# Patient Record
Sex: Male | Born: 1935 | Race: White | Hispanic: No | Marital: Single | Smoking: Former smoker
Health system: Southern US, Community
[De-identification: ages and names within clinical notes are randomized; demographics above are authoritative.]

## PROBLEM LIST (undated history)

## (undated) DIAGNOSIS — K2971 Gastritis, unspecified, with bleeding: Secondary | ICD-10-CM

## (undated) DIAGNOSIS — I10 Essential (primary) hypertension: Secondary | ICD-10-CM

## (undated) DIAGNOSIS — M6281 Muscle weakness (generalized): Secondary | ICD-10-CM

## (undated) DIAGNOSIS — J449 Chronic obstructive pulmonary disease, unspecified: Secondary | ICD-10-CM

## (undated) HISTORY — PX: APPENDECTOMY: SHX54

## (undated) HISTORY — PX: HERNIA REPAIR: SHX51

## (undated) HISTORY — PX: TOTAL HIP ARTHROPLASTY: SHX124

---

## 2017-03-16 ENCOUNTER — Institutional Professional Consult (permissible substitution): Payer: Self-pay | Admitting: Internal Medicine

## 2017-04-12 ENCOUNTER — Inpatient Hospital Stay (HOSPITAL_COMMUNITY)
Admission: EM | Admit: 2017-04-12 | Discharge: 2017-05-08 | DRG: 393 | Disposition: E | Payer: Medicare Other | Attending: Internal Medicine | Admitting: Internal Medicine

## 2017-04-12 ENCOUNTER — Emergency Department (HOSPITAL_COMMUNITY): Payer: Medicare Other

## 2017-04-12 ENCOUNTER — Encounter (HOSPITAL_COMMUNITY): Payer: Self-pay | Admitting: Emergency Medicine

## 2017-04-12 DIAGNOSIS — I700 Atherosclerosis of aorta: Secondary | ICD-10-CM | POA: Diagnosis present

## 2017-04-12 DIAGNOSIS — Z8249 Family history of ischemic heart disease and other diseases of the circulatory system: Secondary | ICD-10-CM | POA: Diagnosis not present

## 2017-04-12 DIAGNOSIS — L89152 Pressure ulcer of sacral region, stage 2: Secondary | ICD-10-CM | POA: Diagnosis present

## 2017-04-12 DIAGNOSIS — J449 Chronic obstructive pulmonary disease, unspecified: Secondary | ICD-10-CM | POA: Diagnosis not present

## 2017-04-12 DIAGNOSIS — Z66 Do not resuscitate: Secondary | ICD-10-CM | POA: Diagnosis present

## 2017-04-12 DIAGNOSIS — I771 Stricture of artery: Secondary | ICD-10-CM | POA: Diagnosis present

## 2017-04-12 DIAGNOSIS — I959 Hypotension, unspecified: Secondary | ICD-10-CM | POA: Diagnosis not present

## 2017-04-12 DIAGNOSIS — Z8711 Personal history of peptic ulcer disease: Secondary | ICD-10-CM | POA: Diagnosis not present

## 2017-04-12 DIAGNOSIS — E875 Hyperkalemia: Secondary | ICD-10-CM

## 2017-04-12 DIAGNOSIS — K55059 Acute (reversible) ischemia of intestine, part and extent unspecified: Principal | ICD-10-CM

## 2017-04-12 DIAGNOSIS — Z808 Family history of malignant neoplasm of other organs or systems: Secondary | ICD-10-CM | POA: Diagnosis not present

## 2017-04-12 DIAGNOSIS — Z9981 Dependence on supplemental oxygen: Secondary | ICD-10-CM

## 2017-04-12 DIAGNOSIS — J189 Pneumonia, unspecified organism: Secondary | ICD-10-CM

## 2017-04-12 DIAGNOSIS — R001 Bradycardia, unspecified: Secondary | ICD-10-CM | POA: Diagnosis not present

## 2017-04-12 DIAGNOSIS — Z77090 Contact with and (suspected) exposure to asbestos: Secondary | ICD-10-CM | POA: Diagnosis present

## 2017-04-12 DIAGNOSIS — I10 Essential (primary) hypertension: Secondary | ICD-10-CM | POA: Diagnosis present

## 2017-04-12 DIAGNOSIS — N179 Acute kidney failure, unspecified: Secondary | ICD-10-CM

## 2017-04-12 DIAGNOSIS — R680 Hypothermia, not associated with low environmental temperature: Secondary | ICD-10-CM | POA: Diagnosis not present

## 2017-04-12 DIAGNOSIS — R0902 Hypoxemia: Secondary | ICD-10-CM | POA: Diagnosis not present

## 2017-04-12 DIAGNOSIS — J432 Centrilobular emphysema: Secondary | ICD-10-CM | POA: Diagnosis present

## 2017-04-12 DIAGNOSIS — E874 Mixed disorder of acid-base balance: Secondary | ICD-10-CM | POA: Diagnosis not present

## 2017-04-12 DIAGNOSIS — K551 Chronic vascular disorders of intestine: Secondary | ICD-10-CM | POA: Diagnosis present

## 2017-04-12 DIAGNOSIS — R0603 Acute respiratory distress: Secondary | ICD-10-CM

## 2017-04-12 DIAGNOSIS — N170 Acute kidney failure with tubular necrosis: Secondary | ICD-10-CM

## 2017-04-12 DIAGNOSIS — J690 Pneumonitis due to inhalation of food and vomit: Secondary | ICD-10-CM

## 2017-04-12 DIAGNOSIS — Z515 Encounter for palliative care: Secondary | ICD-10-CM | POA: Diagnosis not present

## 2017-04-12 DIAGNOSIS — R1032 Left lower quadrant pain: Secondary | ICD-10-CM | POA: Diagnosis present

## 2017-04-12 DIAGNOSIS — L8990 Pressure ulcer of unspecified site, unspecified stage: Secondary | ICD-10-CM | POA: Insufficient documentation

## 2017-04-12 DIAGNOSIS — E860 Dehydration: Secondary | ICD-10-CM | POA: Diagnosis present

## 2017-04-12 DIAGNOSIS — Y95 Nosocomial condition: Secondary | ICD-10-CM | POA: Diagnosis present

## 2017-04-12 DIAGNOSIS — R109 Unspecified abdominal pain: Secondary | ICD-10-CM | POA: Diagnosis present

## 2017-04-12 HISTORY — DX: Muscle weakness (generalized): M62.81

## 2017-04-12 HISTORY — DX: Essential (primary) hypertension: I10

## 2017-04-12 HISTORY — DX: Chronic obstructive pulmonary disease, unspecified: J44.9

## 2017-04-12 HISTORY — DX: Gastritis, unspecified, with bleeding: K29.71

## 2017-04-12 LAB — COMPREHENSIVE METABOLIC PANEL
ALT: 7 U/L — ABNORMAL LOW (ref 17–63)
ANION GAP: 10 (ref 5–15)
AST: 15 U/L (ref 15–41)
Albumin: 3 g/dL — ABNORMAL LOW (ref 3.5–5.0)
Alkaline Phosphatase: 68 U/L (ref 38–126)
BUN: 49 mg/dL — ABNORMAL HIGH (ref 6–20)
CHLORIDE: 95 mmol/L — AB (ref 101–111)
CO2: 30 mmol/L (ref 22–32)
Calcium: 10 mg/dL (ref 8.9–10.3)
Creatinine, Ser: 1.81 mg/dL — ABNORMAL HIGH (ref 0.61–1.24)
GFR, EST AFRICAN AMERICAN: 37 mL/min — AB (ref >60)
GFR, EST NON AFRICAN AMERICAN: 32 mL/min — AB (ref >60)
Glucose, Bld: 114 mg/dL — ABNORMAL HIGH (ref 65–99)
POTASSIUM: 4.3 mmol/L (ref 3.5–5.1)
Sodium: 135 mmol/L (ref 135–145)
Total Bilirubin: 1.2 mg/dL (ref 0.3–1.2)
Total Protein: 7.6 g/dL (ref 6.5–8.1)

## 2017-04-12 LAB — CBC WITH DIFFERENTIAL/PLATELET
BASOS ABS: 0 10*3/uL (ref 0.0–0.1)
Basophils Relative: 0 %
EOS PCT: 1 %
Eosinophils Absolute: 0.1 10*3/uL (ref 0.0–0.7)
HCT: 38.1 % — ABNORMAL LOW (ref 39.0–52.0)
Hemoglobin: 12.6 g/dL — ABNORMAL LOW (ref 13.0–17.0)
LYMPHS PCT: 17 %
Lymphs Abs: 3.3 10*3/uL (ref 0.7–4.0)
MCH: 28.7 pg (ref 26.0–34.0)
MCHC: 33.1 g/dL (ref 30.0–36.0)
MCV: 86.8 fL (ref 78.0–100.0)
MONO ABS: 1.4 10*3/uL — AB (ref 0.1–1.0)
Monocytes Relative: 7 %
Neutro Abs: 15 10*3/uL — ABNORMAL HIGH (ref 1.7–7.7)
Neutrophils Relative %: 75 %
PLATELETS: 424 10*3/uL — AB (ref 150–400)
RBC: 4.39 MIL/uL (ref 4.22–5.81)
RDW: 14.1 % (ref 11.5–15.5)
WBC: 19.8 10*3/uL — ABNORMAL HIGH (ref 4.0–10.5)

## 2017-04-12 LAB — LIPASE, BLOOD: LIPASE: 27 U/L (ref 11–51)

## 2017-04-12 LAB — I-STAT TROPONIN, ED: TROPONIN I, POC: 0.02 ng/mL (ref 0.00–0.08)

## 2017-04-12 LAB — URINALYSIS, ROUTINE W REFLEX MICROSCOPIC
Bilirubin Urine: NEGATIVE
Glucose, UA: NEGATIVE mg/dL
Hgb urine dipstick: NEGATIVE
KETONES UR: NEGATIVE mg/dL
LEUKOCYTES UA: NEGATIVE
NITRITE: NEGATIVE
PROTEIN: NEGATIVE mg/dL
Specific Gravity, Urine: 1.012 (ref 1.005–1.030)
pH: 5 (ref 5.0–8.0)

## 2017-04-12 MED ORDER — VANCOMYCIN HCL IN DEXTROSE 1-5 GM/200ML-% IV SOLN: 1000.0000 mg | Freq: Every day | INTRAVENOUS | Status: DC

## 2017-04-12 MED ORDER — PHENYLEPHRINE-MINERAL OIL-PET 0.25-14-71.9 % RE OINT: 1.0000 "application " | TOPICAL_OINTMENT | Freq: Every day | RECTAL | Status: DC | PRN

## 2017-04-12 MED ORDER — ENOXAPARIN SODIUM 40 MG/0.4ML ~~LOC~~ SOLN
40.0000 mg | SUBCUTANEOUS | Status: DC
  Filled 2017-04-12: qty 0.4

## 2017-04-12 MED ORDER — MOMETASONE FURO-FORMOTEROL FUM 200-5 MCG/ACT IN AERO
2.0000 | INHALATION_SPRAY | Freq: Two times a day (BID) | RESPIRATORY_TRACT | Status: DC
  Administered 2017-04-13 – 2017-04-14 (×2): 2 via RESPIRATORY_TRACT
  Filled 2017-04-12: qty 8.8

## 2017-04-12 MED ORDER — SODIUM CHLORIDE 0.9 % IV SOLN: 250.0000 mL | INTRAVENOUS | Status: DC | PRN

## 2017-04-12 MED ORDER — SODIUM CHLORIDE 0.9 % IV BOLUS (SEPSIS)
1000.0000 mL | Freq: Once | INTRAVENOUS | Status: AC
  Administered 2017-04-12: 1000 mL via INTRAVENOUS

## 2017-04-12 MED ORDER — DEXTROSE 5 % IV SOLN: 1.0000 g | INTRAVENOUS | Status: DC

## 2017-04-12 MED ORDER — PHENYLEPH-SHARK LIV OIL-MO-PET 0.25-3-14-71.9 % RE OINT
TOPICAL_OINTMENT | Freq: Every day | RECTAL | Status: DC | PRN
Start: 2017-04-12 — End: 2017-04-15

## 2017-04-12 MED ORDER — PANTOPRAZOLE SODIUM 40 MG PO TBEC
40.0000 mg | DELAYED_RELEASE_TABLET | Freq: Two times a day (BID) | ORAL | Status: DC
  Administered 2017-04-12: 40 mg via ORAL
  Filled 2017-04-12: qty 1

## 2017-04-12 MED ORDER — MELATONIN 3 MG PO TABS
9.0000 mg | ORAL_TABLET | Freq: Every day | ORAL | Status: DC
  Administered 2017-04-12: 9 mg via ORAL
  Filled 2017-04-12 (×4): qty 3

## 2017-04-12 MED ORDER — SODIUM CHLORIDE 0.9 % IV SOLN
INTRAVENOUS | Status: DC
  Administered 2017-04-12: via INTRAVENOUS

## 2017-04-12 MED ORDER — SALINE SPRAY 0.65 % NA SOLN: 1.0000 | NASAL | Status: DC | PRN

## 2017-04-12 MED ORDER — SODIUM CHLORIDE 0.9% FLUSH: 3.0000 mL | INTRAVENOUS | Status: DC | PRN

## 2017-04-12 MED ORDER — HYDROMORPHONE HCL 1 MG/ML IJ SOLN
0.5000 mg | Freq: Once | INTRAMUSCULAR | Status: AC
  Administered 2017-04-12: 0.5 mg via INTRAVENOUS
  Filled 2017-04-12: qty 0.5

## 2017-04-12 MED ORDER — DEXTROSE 5 % IV SOLN
2.0000 g | Freq: Every day | INTRAVENOUS | Status: DC
  Administered 2017-04-12: 2 g via INTRAVENOUS
  Filled 2017-04-12: qty 2

## 2017-04-12 MED ORDER — BISACODYL 10 MG RE SUPP: 10.0000 mg | Freq: Every day | RECTAL | Status: DC | PRN

## 2017-04-12 MED ORDER — IPRATROPIUM-ALBUTEROL 0.5-2.5 (3) MG/3ML IN SOLN: 3.0000 mL | Freq: Four times a day (QID) | RESPIRATORY_TRACT | Status: DC | PRN

## 2017-04-12 MED ORDER — SODIUM CHLORIDE 0.9% FLUSH
3.0000 mL | Freq: Two times a day (BID) | INTRAVENOUS | Status: DC
  Administered 2017-04-12 – 2017-04-13 (×2): 3 mL via INTRAVENOUS

## 2017-04-12 MED ORDER — HYDROCORTISONE ACETATE 25 MG RE SUPP
25.0000 mg | Freq: Every day | RECTAL | Status: DC
  Filled 2017-04-12 (×3): qty 1

## 2017-04-12 MED ORDER — SODIUM CHLORIDE 0.9 % IV SOLN
3.0000 g | Freq: Two times a day (BID) | INTRAVENOUS | Status: DC
  Administered 2017-04-12 – 2017-04-13 (×2): 3 g via INTRAVENOUS
  Filled 2017-04-12 (×3): qty 3

## 2017-04-12 MED ORDER — MELATONIN 5 MG PO TABS
10.0000 mg | ORAL_TABLET | Freq: Every day | ORAL | Status: DC
  Filled 2017-04-12: qty 2

## 2017-04-12 MED ORDER — TIOTROPIUM BROMIDE MONOHYDRATE 18 MCG IN CAPS
18.0000 ug | ORAL_CAPSULE | Freq: Every day | RESPIRATORY_TRACT | Status: DC
  Administered 2017-04-14: 18 ug via RESPIRATORY_TRACT
  Filled 2017-04-12: qty 5

## 2017-04-12 MED ORDER — SENNOSIDES-DOCUSATE SODIUM 8.6-50 MG PO TABS
2.0000 | ORAL_TABLET | Freq: Every day | ORAL | Status: DC
  Administered 2017-04-12: 2 via ORAL
  Filled 2017-04-12: qty 2

## 2017-04-12 NOTE — Progress Notes (Signed)
Pharmacy Antibiotic Note  Jason GailsHerbert Martin is a 83 y.o. male admitted on 05/03/2017 with Aspiration pneumonia.  Pharmacy has been consulted for unasyn dosing.  Pt has history of chronic aspiration in the setting of COPD.  WBC 19.8, afeb, with a CrCl estimated at 25 mL/min.    Plan: Start ampicillin/sulbactam 3 g IV q 12h F/u kidney function/updated ht+wt and adj dose as appropriate F/u CXR, clinical progression and LOT   Weight: 140 lb (63.5 kg)  Temp (24hrs), Avg:97.5 F (36.4 C), Min:97.5 F (36.4 C), Max:97.5 F (36.4 C)   Recent Labs Lab 17-Aug-2017 0910  WBC 19.8*  CREATININE 1.81*    CrCl cannot be calculated (Unknown ideal weight.).    No Known Allergies  Antimicrobials this admission: 8/6 unasyn>  Dose adjustments this admission: n/a  Microbiology results: None collected  Jason PoseyJonathan Valeda Martin, PharmD Pharmacy Resident Pager #: [phone] 04/14/2017 6:58 PM

## 2017-04-12 NOTE — ED Provider Notes (Signed)
[facility]-EMERGENCY DEPT Provider Note   CSN: [id number] Arrival date & time: 21-Apr-2017  0849     History   Chief Complaint Chief Complaint  Patient presents with  . Chest Pain    HPI Jason Martin is a 83 y.o. male.  The history is provided by the patient and medical records.  Chest Pain   Associated symptoms include abdominal pain and shortness of breath.    83 year old male with history of COPD, gastritis, hypertension, muscle weakness, presenting to the ED with chest pain and abdominal pain. Patient reports he has had abdominal pain for about 2 weeks now. States it is mostly in the lower abdomen, worse on his left side. States pain is worse when he tries to eat so he has been mostly drinking just liquids for about 2 weeks now. He denies any fever or chills. No vomiting. He denies any diarrhea. States his buttocks is sore as well.  Patient states "every now and again" he will get some chest pain. Patient cannot elaborate on this further, states it just comes and goes. Does not seem that there are any alleviating or exacerbating factors associated. States he is short of breath "all the time". Patient is on 2-3L supplemental oxygen at all times.  Past Medical History:  Diagnosis Date  . COPD (chronic obstructive pulmonary disease) (HCC)   . Gastritis with bleeding   . Hypertension   . Muscle weakness     There are no active problems to display for this patient.   No past surgical history on file.     Home Medications    Prior to Admission medications   Not on File    Family History No family history on file.  Social History Social History  Substance Use Topics  . Smoking status: Not on file  . Smokeless tobacco: Not on file  . Alcohol use Not on file     Allergies   Patient has no known allergies.   Review of Systems Review of Systems  Respiratory: Positive for shortness of breath.   Cardiovascular: Positive for chest pain.  Gastrointestinal: Positive for  abdominal pain.  All other systems reviewed and are negative.    Physical Exam Updated Vital Signs BP (!) 133/101   Pulse 90   Temp (!) 97.5 F (36.4 C) (Oral)   Resp 16   SpO2 96%   Physical Exam  Constitutional: He is oriented to person, place, and time. He appears well-developed and well-nourished.  HENT:  Head: Normocephalic and atraumatic.  Mouth/Throat: Oropharynx is clear and moist.  Eyes: Pupils are equal, round, and reactive to light. Conjunctivae and EOM are normal.  Neck: Normal range of motion.  Cardiovascular: Normal rate, regular rhythm and normal heart sounds.   Pulmonary/Chest: Effort normal and breath sounds normal.  Breath sounds are junky, wet cough that is non-productive, O2 sats 98% on 2L  Abdominal: Soft. Bowel sounds are normal. There is tenderness in the left lower quadrant.    Genitourinary:  Genitourinary Comments: Stage II decubitus ulcer of sacrum with barrier cream on top; surrounding skin appears irritated; there is no purulent drainage, crusting, or necrotic skin changes present  Musculoskeletal: Normal range of motion.  Neurological: He is alert and oriented to person, place, and time.  Skin: Skin is warm and dry.  Psychiatric: He has a normal mood and affect.  Nursing note and vitals reviewed.    ED Treatments / Results  Labs (all labs ordered are listed, but only abnormal results are  displayed) Labs Reviewed  CBC WITH DIFFERENTIAL/PLATELET - Abnormal; Notable for the following:       Result Value   WBC 19.8 (*)    Hemoglobin 12.6 (*)    HCT 38.1 (*)    Platelets 424 (*)    Neutro Abs 15.0 (*)    Monocytes Absolute 1.4 (*)    All other components within normal limits  COMPREHENSIVE METABOLIC PANEL - Abnormal; Notable for the following:    Chloride 95 (*)    Glucose, Bld 114 (*)    BUN 49 (*)    Creatinine, Ser 1.81 (*)    Albumin 3.0 (*)    ALT 7 (*)    GFR calc non Af Amer 32 (*)    GFR calc Af Amer 37 (*)    All other  components within normal limits  LIPASE, BLOOD  URINALYSIS, ROUTINE W REFLEX MICROSCOPIC  I-STAT TROPONIN, ED    EKG  EKG Interpretation  Date/Time:  Monday April 12 2017 09:01:38 EDT Ventricular Rate:  94 PR Interval:    QRS Duration: 112 QT Interval:  376 QTC Calculation: 471 R Axis:   81 Text Interpretation:  Sinus rhythm Ventricular premature complex Borderline prolonged PR interval Borderline intraventricular conduction delay Low voltage, extremity and precordial leads Borderline repolarization abnormality No old tracing to compare Confirmed by Rolan Bucco [phone]) on 04/20/2017 9:04:15 AM       Radiology Ct Abdomen Pelvis Wo Contrast  Result Date: 04/14/2017 CLINICAL DATA:  83 year old male with history of left lower quadrant abdominal pain for the past week. Abnormal chest x-ray today. EXAM: CT CHEST, ABDOMEN AND PELVIS WITHOUT CONTRAST TECHNIQUE: Multidetector CT imaging of the chest, abdomen and pelvis was performed following the standard protocol without IV contrast. COMPARISON:  No priors. FINDINGS: CT CHEST FINDINGS Cardiovascular: Heart size is normal. There is no significant pericardial fluid, thickening or pericardial calcification. There is aortic atherosclerosis, as well as atherosclerosis of the great vessels of the mediastinum and the coronary arteries, including calcified atherosclerotic plaque in the left main, left anterior descending, left circumflex and right coronary arteries. Calcifications of the aortic valve and mitral annulus. Mediastinum/Nodes: No pathologically enlarged mediastinal or hilar lymph nodes. Esophagus is unremarkable in appearance. No axillary lymphadenopathy. Lungs/Pleura: There is near complete collapse/consolidation of the left lower lobe, with extensive bronchiectasis within the left lower lobe, much of which is opacified by retained mucus. Widespread bronchial wall thickening, thickening of the peribronchovascular interstitium with  peribronchovascular micro and macronodularity noted elsewhere in the lungs bilaterally. Background of mild to moderate centrilobular and paraseptal emphysema. Thick-walled cavity measuring approximately 2.3 x 5.7 x 4.0 cm (axial image 50 of series 5) in the posterior aspect of the left upper lobe has some dependent fluid/ debris. Musculoskeletal: Mild compression of superior endplate anteriorly at T6 with loss in 10% loss of anterior vertebral body height. Multiple old healed left-sided rib fractures. There are no aggressive appearing lytic or blastic lesions noted in the visualized portions of the skeleton. CT ABDOMEN PELVIS FINDINGS Hepatobiliary: No definite cystic or solid hepatic lesions are identified on today's noncontrast CT examination. Tiny partially calcified gallstones lie dependently in the gallbladder. Gallbladder is moderately distended. No gallbladder wall thickening, pericholecystic fluid or surrounding inflammatory changes are noted to strongly suggest an acute cholecystitis at this time. Pancreas: No definite pancreatic mass or peripancreatic inflammatory changes are noted on today's noncontrast CT examination. Spleen: Unremarkable. Adrenals/Urinary Tract: Unenhanced appearance of the kidneys and bilateral adrenal glands is normal. No hydroureteronephrosis. Urinary  bladder is partially obscured by beam hardening artifact from the patient's left hip arthroplasty, but visualized portions of the urinary bladder are unremarkable in appearance. Stomach/Bowel: Unenhanced appearance of the stomach is normal. There is no pathologic dilatation of small bowel or colon. Numerous colonic diverticulae are noted, most evident in the sigmoid colon and descending colon, without overt surrounding inflammatory changes to strongly suggest an acute diverticulitis at this time. The appendix is not confidently identified and may be surgically absent. Regardless, there are no inflammatory changes noted adjacent to the  cecum to suggest the presence of an acute appendicitis at this time. Vascular/Lymphatic: Aortic atherosclerosis, without definite aneurysm in the abdominal or pelvic vasculature. No lymphadenopathy noted in the abdomen or pelvis on today's noncontrast CT examination. Reproductive: Prostate gland and seminal vesicles are unremarkable in appearance. Other: No significant volume of ascites.  No pneumoperitoneum. Musculoskeletal: Status post left hip arthroplasty. Old healed fractures of the left superior and inferior pubic rami. There are no aggressive appearing lytic or blastic lesions noted in the visualized portions of the skeleton. IMPRESSION: 1. No acute findings in the abdomen or pelvis to account for the patient's symptoms. However, there is extensive colonic diverticulosis, particularly of the descending colon and sigmoid colon. 2. 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 At this time, this is complicated by a near complete collapse and consolidation of the left lower lobe, as well as the presence of a thick-walled cavitary area in the posterior aspect of the left upper lobe, as detailed above. 3. Mild to moderate centrilobular and paraseptal emphysema; imaging findings suggestive of underlying COPD. 4. Aortic atherosclerosis, in addition to left main and 3 vessel coronary artery disease. 5. There are calcifications of the aortic valve and mitral annulus. Echocardiographic correlation for evaluation of potential valvular dysfunction may be warranted if clinically indicated. Aortic Atherosclerosis (ICD10-I70.0) and Emphysema (ICD10-J43.9). Electronically Signed   By: Trudie Reedaniel  Entrikin M.D.   On: 04/19/2017 11:08   Dg Chest 2 View  Result Date: 04/09/2017 CLINICAL DATA:  Chest pain, shortness of breath. EXAM: CHEST  2 VIEW COMPARISON:  None. FINDINGS: The cardiomediastinal silhouette is normal in size. Atherosclerotic  calcification of the aortic arch. Changes of COPD with patchy increased density and atelectasis within the left lung. Increased retrocardiac density with silhouetting of the descending aorta. Patchy density in the right lower lobe. Small left pleural effusion. No acute osseous abnormality. IMPRESSION: 1. Reduced left lung volume with possible left lower lobe collapse. Recommend CT chest with contrast for further evaluation. 2. Patchy opacities throughout the left lung and within the right lower lobe, which could represent pneumonia in the proper clinical setting. 3.  Emphysema. (ICD10-J43.9) 4.  Aortic Atherosclerosis (ICD10-I70.0). Electronically Signed   By: Obie DredgeWilliam T Derry M.D.   On: 04/11/2017 09:52   Ct Chest Wo Contrast  Result Date: 04/26/2017 CLINICAL DATA:  83 year old male with history of left lower quadrant abdominal pain for the past week. Abnormal chest x-ray today. EXAM: CT CHEST, ABDOMEN AND PELVIS WITHOUT CONTRAST TECHNIQUE: Multidetector CT imaging of the chest, abdomen and pelvis was performed following the standard protocol without IV contrast. COMPARISON:  No priors. FINDINGS: CT CHEST FINDINGS Cardiovascular: Heart size is normal. There is no significant pericardial fluid, thickening or pericardial calcification. There is aortic atherosclerosis, as well as atherosclerosis of the great vessels of the mediastinum and the coronary arteries, including calcified atherosclerotic plaque in the left main, left anterior descending, left  circumflex and right coronary arteries. Calcifications of the aortic valve and mitral annulus. Mediastinum/Nodes: No pathologically enlarged mediastinal or hilar lymph nodes. Esophagus is unremarkable in appearance. No axillary lymphadenopathy. Lungs/Pleura: There is near complete collapse/consolidation of the left lower lobe, with extensive bronchiectasis within the left lower lobe, much of which is opacified by retained mucus. Widespread bronchial wall thickening,  thickening of the peribronchovascular interstitium with peribronchovascular micro and macronodularity noted elsewhere in the lungs bilaterally. Background of mild to moderate centrilobular and paraseptal emphysema. Thick-walled cavity measuring approximately 2.3 x 5.7 x 4.0 cm (axial image 50 of series 5) in the posterior aspect of the left upper lobe has some dependent fluid/ debris. Musculoskeletal: Mild compression of superior endplate anteriorly at T6 with loss in 10% loss of anterior vertebral body height. Multiple old healed left-sided rib fractures. There are no aggressive appearing lytic or blastic lesions noted in the visualized portions of the skeleton. CT ABDOMEN PELVIS FINDINGS Hepatobiliary: No definite cystic or solid hepatic lesions are identified on today's noncontrast CT examination. Tiny partially calcified gallstones lie dependently in the gallbladder. Gallbladder is moderately distended. No gallbladder wall thickening, pericholecystic fluid or surrounding inflammatory changes are noted to strongly suggest an acute cholecystitis at this time. Pancreas: No definite pancreatic mass or peripancreatic inflammatory changes are noted on today's noncontrast CT examination. Spleen: Unremarkable. Adrenals/Urinary Tract: Unenhanced appearance of the kidneys and bilateral adrenal glands is normal. No hydroureteronephrosis. Urinary bladder is partially obscured by beam hardening artifact from the patient's left hip arthroplasty, but visualized portions of the urinary bladder are unremarkable in appearance. Stomach/Bowel: Unenhanced appearance of the stomach is normal. There is no pathologic dilatation of small bowel or colon. Numerous colonic diverticulae are noted, most evident in the sigmoid colon and descending colon, without overt surrounding inflammatory changes to strongly suggest an acute diverticulitis at this time. The appendix is not confidently identified and may be surgically absent. Regardless,  there are no inflammatory changes noted adjacent to the cecum to suggest the presence of an acute appendicitis at this time. Vascular/Lymphatic: Aortic atherosclerosis, without definite aneurysm in the abdominal or pelvic vasculature. No lymphadenopathy noted in the abdomen or pelvis on today's noncontrast CT examination. Reproductive: Prostate gland and seminal vesicles are unremarkable in appearance. Other: No significant volume of ascites.  No pneumoperitoneum. Musculoskeletal: Status post left hip arthroplasty. Old healed fractures of the left superior and inferior pubic rami. There are no aggressive appearing lytic or blastic lesions noted in the visualized portions of the skeleton. IMPRESSION: 1. No acute findings in the abdomen or pelvis to account for the patient's symptoms. However, there is extensive colonic diverticulosis, particularly of the descending colon and sigmoid colon. 2. 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 At this time, this is complicated by a near complete collapse and consolidation of the left lower lobe, as well as the presence of a thick-walled cavitary area in the posterior aspect of the left upper lobe, as detailed above. 3. Mild to moderate centrilobular and paraseptal emphysema; imaging findings suggestive of underlying COPD. 4. Aortic atherosclerosis, in addition to left main and 3 vessel coronary artery disease. 5. There are calcifications of the aortic valve and mitral annulus. Echocardiographic correlation for evaluation of potential valvular dysfunction may be warranted if clinically indicated. Aortic Atherosclerosis (ICD10-I70.0) and Emphysema (ICD10-J43.9). Electronically Signed   By: Trudie Reed M.D.   On: 04/16/2017 11:08    Procedures Procedures (including critical care time)  Medications Ordered in ED Medications - No data to display   Initial Impression /  Assessment and Plan / ED Course  I have reviewed the triage vital signs and the nursing notes.  Pertinent labs & imaging results that were available during my care of the patient were reviewed by me and considered in my medical decision making (see chart for details).  83 year old male here with chest and abdominal pain. Reports lower abdominal pain for about 2 weeks. Reports his appetite has been poor because of this. On exam he does have some tenderness in the left lower abdomen without rebound or guarding.  On exam he does appear clinically dry.  Breath sounds are junky, voice is raspy.  O2 sats stable on his baseline O2.  He is mildly hypothermic. Labs with leukocytosis of 19,000. Creatinine is bumped to 1.81, BUN elevated at 49. Suspect component of dehydration.  Chest x-ray was obtained revealing possible complete left lower lobe collapse. Patchy opacities throughout left lung also noted. Recommended chest CT. Given his elevated creatinine, will need CT scans without contrast. These have been ordered.    CT scan of the abdomen without acute findings.  CT of the chest revealing likely chronic indolent atypical infectious process such as MAI with apparent superimposed acute infection with nearly complete collapse of the LLL.  Given these findings, discussed with pulmonology, Dr. Marlyne Beards-- recommends treatment for HCAP/aspiration, incentive spirometry to try to obtain sputum cultures for now and try to tease out finer details of possible chronic processes at a later time.  Patient appears stable to admit to medicine service.  Discussed with IM teaching service, they will admit for ongoing care.  Patient started on vanc/cefepime in ED.  Final Clinical Impressions(s) / ED Diagnoses   Final diagnoses:  HCAP (healthcare-associated pneumonia)  AKI (acute kidney injury) [facility])    New Prescriptions New Prescriptions   No medications on file     Garlon Hatchet, PA-C 04/19/2017 1503    Rolan Bucco, MD 04/13/17 [phone]

## 2017-04-12 NOTE — ED Notes (Signed)
Patient transported to X-ray 

## 2017-04-12 NOTE — Progress Notes (Addendum)
Pt. Requesting med for lower left quad pain. On cal MD for IMTS paged to make aware.

## 2017-04-12 NOTE — ED Notes (Signed)
Provider has decided to cancel the antibiotic consults. Cefepime already infusing, but will D/C further doses.

## 2017-04-12 NOTE — ED Notes (Signed)
ED Provider at bedside. 

## 2017-04-12 NOTE — Progress Notes (Signed)
Pharmacy Antibiotic Note  Jason GailsHerbert Martin is a 83 y.o. male admitted on 05/02/2017 with pneumonia.  Pharmacy has been consulted for vancomycin and cefepime dosing. WBC 19.8, Temp 97.5, Scr 1.81 (CrCl ~ 26) Provider has decided to cancel the antibiotic consults. Cefepime already infusing, but will D/C further doses.  Plan: Cefepime 2G IV once   Weight: 140 lb (63.5 kg)  Temp (24hrs), Avg:97.5 F (36.4 C), Min:97.5 F (36.4 C), Max:97.5 F (36.4 C)   Recent Labs Lab 02-07-17 0910  WBC 19.8*  CREATININE 1.81*    CrCl cannot be calculated (Unknown ideal weight.).    No Known Allergies   Thank you for allowing pharmacy to be a part of this patient's care.  Toniann Failony L Norina Cowper 04/26/2017 2:26 PM

## 2017-04-12 NOTE — H&P (Signed)
Date: 19-Apr-2017               Patient Name:  Jason Martin MRN: [id number]  DOB: 06/19/1926 Age / Sex: 83 y.o., male   PCP: System, Pcp Not In         Medical Service: Internal Medicine Teaching Service         Attending Physician: Dr. Rolan Bucco, MD    First Contact: Dr. Scherrie Gerlach Pager: [phone]  Second Contact: Dr. Valentino Nose Pager: [phone]       After Hours (After 5p/  First Contact Pager: [phone]  weekends / holidays): Second Contact Pager: [phone]   Chief Complaint: abdominal pain and intermittent chest pain  History of Present Illness:  Mr. Pless is an 82yo male with history of COPD (on 2-3L O2 at home) and HTN who presents to the ED with 1 week history of abdominal pain and intermittent chest pain. The pain is in the LLQ and occasionally radiates up to the center of his chest. He was sitting in bed when the pain started. Has had decreased appetite for the last 2-3 weeks. No pain with or after eating. Last BM 3-4 days ago. Has not had changes in BMs. No N/V. He has had ~8lb weight loss over the last month. He often coughs after eating/drinking. Lives in a facility [facility]  In the ER, temp 97.5, HR 91, BP 115/71, 97% on 3L O2. CXR with patchy opacities in L lung and RLL, emphysema, and atherosclerosis. CT C/A/P with colonic diverticulosis; emphysema; chronic indolent atypical infectious process with thick-walled cavitary area in LUL. Received 1 dose of cefepime in the ER and 1L NS bolus. Normal lipase and LFTs. Pulm was consulted.  Meds:  Current Meds  Medication Sig  . budesonide-formoterol (SYMBICORT) 160-4.5 MCG/ACT inhaler Inhale 2 puffs into the lungs 2 (two) times daily.  . furosemide (LASIX) 40 MG tablet Take 40 mg by mouth daily.  . hydrocortisone (ANUSOL-HC) 25 MG suppository Place 25 mg rectally daily.  Marland Kitchen ipratropium-albuterol (DUONEB) 0.5-2.5 (3) MG/3ML SOLN Take 3 mLs by nebulization every 6 (six) hours as needed (For  wheezing/shortness of breath.).  Marland Kitchen Melatonin 10 MG TABS Take 10 mg by mouth at bedtime.  . pantoprazole (PROTONIX) 40 MG tablet Take 40 mg by mouth 2 (two) times daily.  Marland Kitchen Phenylephrine-Mineral Oil-Pet 0.25-14-71.9 % OINT Place 1 application rectally daily as needed (for hemorroids).  . potassium chloride SA (K-DUR,KLOR-CON) 20 MEQ tablet Take 20 mEq by mouth daily.  Marland Kitchen SALINE MIST SPRAY NA Place 1 spray into the nose every 4 (four) hours as needed (For sinuses).  . tiotropium (SPIRIVA) 18 MCG inhalation capsule Place 18 mcg into inhaler and inhale daily.   Allergies: Allergies as of 04-19-17  . (No Known Allergies)   Past Medical History:  Diagnosis Date  . COPD (chronic obstructive pulmonary disease) (HCC)   . Gastritis with bleeding   . Hypertension   . Muscle weakness    Family History: Family History  Problem Relation Age of Onset  . Skin cancer Father   . Hypertension Father    Social History: Denies smoking (quit in 1973) - used to smoke 2ppd x 2yrs Rare alcohol use Denies other illicit drugs  Review of Systems: Constitutional: Negative for diaphoresis, fever, malaise/fatigue. Positive for weight loss. HEENT: Negative for blurred vision, hearing loss, sinus pain, congestion, and sore throat. Respiratory: Negative for shortness of breath and wheezing. Positive for cough, particularly after eating. Cardiovascular: Negative for  palpitations, orthopnea, PND, and leg swelling. Positive for intermittent chest pain and DOE. Gastrointestinal: Negative for blood in stool, diarrhea, heartburn, nausea and vomiting. Positive for abdominal pain and constipation. Genitourinary: Negative for dysuria and hematuria. Positive for polyuria for 3 weeks. Musculoskeletal: Negative for joint pain and myalgias. Neurological: Negative for dizziness, focal weakness, and headaches.  Physical Exam: Blood pressure 137/80, pulse 83, temperature (!) 97.5 F (36.4 C), temperature source Oral,  resp. rate 20, SpO2 96 %.  GEN: Very thin white male lying comfortably in bed in NAD EYES: EOMI. Sclera anicteric. RESP: Coarse breath sounds bilaterally. Normal work of breathing. O2 sats 98% on 3L. CV: Normal rate and regular rhythm. No murmurs, gallops, or rubs. No LE edema. ABD: Soft. Non-distended. Hypoactive bowel sounds. Mild TTP in LLQ, otherwise non-tender EXT: No clubbing, cyanosis, or edema. 2+ DP and radial pulses. NEURO: Cranial nerves II-XII grossly intact. Able to lift all four extremities against gravity. SKIN: Warm and dry PSYCH: Normal mood and affect.  CBC    Component Value Date/Time   WBC 19.8 (H) 09/20/16 0910   RBC 4.39 09/20/16 0910   HGB 12.6 (L) 09/20/16 0910   HCT 38.1 (L) 09/20/16 0910   PLT 424 (H) 09/20/16 0910   MCV 86.8 09/20/16 0910   MCH 28.7 09/20/16 0910   MCHC 33.1 09/20/16 0910   RDW 14.1 09/20/16 0910   LYMPHSABS 3.3 09/20/16 0910   MONOABS 1.4 (H) 09/20/16 0910   EOSABS 0.1 09/20/16 0910   BASOSABS 0.0 09/20/16 0910   BMET    Component Value Date/Time   NA 135 09/20/16 0910   K 4.3 09/20/16 0910   CL 95 (L) 09/20/16 0910   CO2 30 09/20/16 0910   GLUCOSE 114 (H) 09/20/16 0910   BUN 49 (H) 09/20/16 0910   CREATININE 1.81 (H) 09/20/16 0910   CALCIUM 10.0 09/20/16 0910   GFRNONAA 32 (L) 09/20/16 0910   GFRAA 37 (L) 09/20/16 0910   EKG: Sinus rhythm; borderline IV conduction delay; PVC; borderline prolonged PR interval  CXR 8/6: 1. Reduced left lung volume with possible left lower lobe collapse. Recommend CT chest with contrast for further evaluation. 2. Patchy opacities throughout the left lung and within the right lower lobe, which could represent pneumonia in the proper clinical setting. 3.  Emphysema 4.  Aortic Atherosclerosis  CT chest/abdomen/pelvis 8/6 1. No acute findings in the abdomen or pelvis to account for the patient's symptoms. However, there is extensive colonic  diverticulosis, particularly of the descending colon and sigmoid colon. 2. 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 At this time, this is complicated by a near complete collapse and consolidation of the left lower lobe, as well as the presence of a thick-walled cavitary area in the posterior aspect of the left upper lobe, as detailed above. 3. Mild to moderate centrilobular and paraseptal emphysema; imaging findings suggestive of underlying COPD. 4. Aortic atherosclerosis, in addition to left main and 3 vessel coronary artery disease. 5. There are calcifications of the aortic valve and mitral annulus. Echocardiographic correlation for evaluation of potential valvular dysfunction may be warranted if clinically indicated.  Assessment & Plan by Problem: Active Problems:   * No active hospital problems. *  # Abdominal pain with intermittent radiation to chest Etiology: constipation vs chronic mesenteric ischemia. CT with no evidence of acute findings in abdomen or pelvis. Patient hasn't had a bowel movement in 3 or 4  days. Unclear whether his abdominal pain is related to BMs. Further, patient with fairly extensive atherosclerosis in aorta, great vessels, and coronary arteries, and possibly ?pain out of proportion to exam. EKG with no acute changes. Patient does note indigestion/burning symptoms. - Bowel regimen - NPO until swallow study eval complete - CBC, BMET in AM  # ?Aspiration PNA in setting of chronic aspiration and underlying COPD CXR with patchy opacities in L lung and RLL, concerning for possible aspiration PNA. CT chest with chronic emphysematous changes and bronchiectasis, as well as thick-walled cavity in posterior LUL. On 2-3L O2 at home. Patient also reports coughing while swallowing liquids. - Pulmonology consulted, appreciate their recs - Continue O2 as needed. - Unasyn - Speech/swallow  eval - NPO for now  # AKI Likely due to dehydration. Cr 1.81, BUN 49 (unknown baseline Cr). Dry on physical exam. Received 1L NS bolus - Cont IVF  # HTN BP stable here - Continue to monitor BP  Dispo: Admit patient to Observation with expected length of stay less than 2 midnights.  Signed: Scherrie Gerlach, MD 05/02/2017, 1:43 PM  Pager: Demetrius Charity [phone]

## 2017-04-12 NOTE — ED Triage Notes (Signed)
Pt arrives via [facility] from [facility] for c/o abdominal pain that radiates to his chest x1 week.  Pt wears 2-3L O2 at baseline, denied sob. resp e/u, nad.

## 2017-04-12 NOTE — ED Notes (Signed)
Patient transported to CT 

## 2017-04-12 NOTE — ED Notes (Addendum)
Sacral mepilex dressing applied to sacral wound, open and subcutaneous tissue visible to wound.

## 2017-04-13 ENCOUNTER — Inpatient Hospital Stay (HOSPITAL_COMMUNITY): Payer: Medicare Other

## 2017-04-13 DIAGNOSIS — I10 Essential (primary) hypertension: Secondary | ICD-10-CM

## 2017-04-13 DIAGNOSIS — K55059 Acute (reversible) ischemia of intestine, part and extent unspecified: Principal | ICD-10-CM

## 2017-04-13 DIAGNOSIS — J690 Pneumonitis due to inhalation of food and vomit: Secondary | ICD-10-CM

## 2017-04-13 DIAGNOSIS — Z9981 Dependence on supplemental oxygen: Secondary | ICD-10-CM

## 2017-04-13 DIAGNOSIS — L89152 Pressure ulcer of sacral region, stage 2: Secondary | ICD-10-CM

## 2017-04-13 DIAGNOSIS — E875 Hyperkalemia: Secondary | ICD-10-CM

## 2017-04-13 DIAGNOSIS — R1032 Left lower quadrant pain: Secondary | ICD-10-CM

## 2017-04-13 DIAGNOSIS — N179 Acute kidney failure, unspecified: Secondary | ICD-10-CM

## 2017-04-13 DIAGNOSIS — J449 Chronic obstructive pulmonary disease, unspecified: Secondary | ICD-10-CM

## 2017-04-13 LAB — BASIC METABOLIC PANEL
Anion gap: 11 (ref 5–15)
BUN: 38 mg/dL — ABNORMAL HIGH (ref 6–20)
CALCIUM: 9.1 mg/dL (ref 8.9–10.3)
CO2: 25 mmol/L (ref 22–32)
CREATININE: 1.45 mg/dL — AB (ref 0.61–1.24)
Chloride: 101 mmol/L (ref 101–111)
GFR calc non Af Amer: 41 mL/min — ABNORMAL LOW (ref >60)
GFR, EST AFRICAN AMERICAN: 48 mL/min — AB (ref >60)
Glucose, Bld: 107 mg/dL — ABNORMAL HIGH (ref 65–99)
Potassium: 3.5 mmol/L (ref 3.5–5.1)
Sodium: 137 mmol/L (ref 135–145)

## 2017-04-13 LAB — CBC
HCT: 34.2 % — ABNORMAL LOW (ref 39.0–52.0)
Hemoglobin: 11.2 g/dL — ABNORMAL LOW (ref 13.0–17.0)
MCH: 28.4 pg (ref 26.0–34.0)
MCHC: 32.7 g/dL (ref 30.0–36.0)
MCV: 86.6 fL (ref 78.0–100.0)
PLATELETS: 420 10*3/uL — AB (ref 150–400)
RBC: 3.95 MIL/uL — AB (ref 4.22–5.81)
RDW: 14.1 % (ref 11.5–15.5)
WBC: 23.2 10*3/uL — ABNORMAL HIGH (ref 4.0–10.5)

## 2017-04-13 LAB — BLOOD GAS, ARTERIAL
ACID-BASE DEFICIT: 2.7 mmol/L — AB (ref 0.0–2.0)
Bicarbonate: 22.5 mmol/L (ref 20.0–28.0)
DRAWN BY: 331761
FIO2: 100
O2 Saturation: 91.1 %
PATIENT TEMPERATURE: 98.6
PH ART: 7.313 — AB (ref 7.350–7.450)
pCO2 arterial: 45.7 mmHg (ref 32.0–48.0)
pO2, Arterial: 68.3 mmHg — ABNORMAL LOW (ref 83.0–108.0)

## 2017-04-13 LAB — MRSA PCR SCREENING: MRSA BY PCR: NEGATIVE

## 2017-04-13 MED ORDER — SODIUM CHLORIDE 0.9 % IV SOLN
INTRAVENOUS | Status: AC
  Administered 2017-04-13 (×2): via INTRAVENOUS

## 2017-04-13 MED ORDER — LEVALBUTEROL HCL 0.63 MG/3ML IN NEBU
0.6300 mg | INHALATION_SOLUTION | Freq: Three times a day (TID) | RESPIRATORY_TRACT | Status: DC
  Administered 2017-04-13 – 2017-04-15 (×6): 0.63 mg via RESPIRATORY_TRACT
  Filled 2017-04-13 (×6): qty 3

## 2017-04-13 MED ORDER — POTASSIUM CHLORIDE 10 MEQ/100ML IV SOLN: 10.0000 meq | Freq: Once | INTRAVENOUS | Status: DC

## 2017-04-13 MED ORDER — BISACODYL 10 MG RE SUPP
10.0000 mg | Freq: Every day | RECTAL | Status: DC
  Administered 2017-04-13: 10 mg via RECTAL
  Filled 2017-04-13: qty 1

## 2017-04-13 MED ORDER — FUROSEMIDE 10 MG/ML IJ SOLN
INTRAMUSCULAR | Status: AC
  Administered 2017-04-13: 40 mg
  Filled 2017-04-13: qty 4

## 2017-04-13 MED ORDER — PIPERACILLIN-TAZOBACTAM 3.375 G IVPB 30 MIN: 3.3750 g | Freq: Four times a day (QID) | INTRAVENOUS | Status: DC

## 2017-04-13 MED ORDER — ACETAMINOPHEN 650 MG RE SUPP
650.0000 mg | RECTAL | Status: DC | PRN
  Administered 2017-04-13 – 2017-04-14 (×3): 650 mg via RECTAL
  Filled 2017-04-13 (×3): qty 1

## 2017-04-13 MED ORDER — PIPERACILLIN-TAZOBACTAM 3.375 G IVPB
3.3750 g | Freq: Three times a day (TID) | INTRAVENOUS | Status: DC
  Administered 2017-04-13 – 2017-04-14 (×2): 3.375 g via INTRAVENOUS
  Filled 2017-04-13 (×5): qty 50

## 2017-04-13 MED ORDER — LEVALBUTEROL HCL 0.63 MG/3ML IN NEBU: 0.6300 mg | INHALATION_SOLUTION | Freq: Four times a day (QID) | RESPIRATORY_TRACT | Status: DC | PRN

## 2017-04-13 MED ORDER — IPRATROPIUM-ALBUTEROL 0.5-2.5 (3) MG/3ML IN SOLN: 3.0000 mL | Freq: Four times a day (QID) | RESPIRATORY_TRACT | Status: DC

## 2017-04-13 MED ORDER — POTASSIUM CHLORIDE 10 MEQ/100ML IV SOLN
10.0000 meq | INTRAVENOUS | Status: AC
  Administered 2017-04-13 (×2): 10 meq via INTRAVENOUS
  Filled 2017-04-13 (×2): qty 100

## 2017-04-13 MED ORDER — ALBUTEROL SULFATE (2.5 MG/3ML) 0.083% IN NEBU: 2.5000 mg | INHALATION_SOLUTION | RESPIRATORY_TRACT | Status: DC | PRN

## 2017-04-13 MED ORDER — IPRATROPIUM-ALBUTEROL 0.5-2.5 (3) MG/3ML IN SOLN
3.0000 mL | Freq: Three times a day (TID) | RESPIRATORY_TRACT | Status: DC
  Filled 2017-04-13: qty 3

## 2017-04-13 MED ORDER — SODIUM CHLORIDE 0.9 % IV SOLN
INTRAVENOUS | Status: DC
  Administered 2017-04-14 (×2): via INTRAVENOUS

## 2017-04-13 MED ORDER — HYDROMORPHONE HCL 1 MG/ML IJ SOLN
0.5000 mg | Freq: Once | INTRAMUSCULAR | Status: AC
  Administered 2017-04-13: 0.5 mg via INTRAVENOUS
  Filled 2017-04-13: qty 0.5

## 2017-04-13 MED ORDER — IPRATROPIUM BROMIDE 0.02 % IN SOLN
0.5000 mg | Freq: Three times a day (TID) | RESPIRATORY_TRACT | Status: DC
  Administered 2017-04-13 – 2017-04-15 (×6): 0.5 mg via RESPIRATORY_TRACT
  Filled 2017-04-13 (×6): qty 2.5

## 2017-04-13 NOTE — Significant Event (Signed)
Rapid Response Event Note  Overview: Time Called: 0747 Arrival Time: 0710 Event Type: Respiratory  Initial Focused Assessment: Patient in acute respiratory distress,  Increased work of breathing.  Rhonchi through out, worse on right.  HR 160 Patient alert, but not able to speak because of respiratory distress 166/75  HR 160  RR 32-38  O2 sat 80s on venturi mask Dr Josem KaufmannKlima and resident team at bedside  Interventions: 40mg  lasix IV Placed on 100% NRB,  O2 sats improved to 91-94%  RR still upper 30s RT attempted abg, unable to draw blood Attempted NTS PCXR done  Awaiting Stepdown level bed availability  0930 Patient transferred to 3M13 via bed with O2 and heart monitor.   Plan of Care (if not transferred):  Event Summary: Name of Physician Notified: Dr Josem KaufmannKlima and residents at bedside at      at    Outcome: Transferred (Comment)  Event End Time: 0945  Marcellina MillinLayton, Jeidy Hoerner

## 2017-04-13 NOTE — Progress Notes (Signed)
SLP Cancellation Note  Patient Details Name: Renato GailsHerbert Williamsen MRN: [id number] DOB: 10/01/1925   Cancelled treatment:       Reason Eval/Treat Not Completed: Medical issues which prohibited therapy.  Will follow for readiness for swallow assessment.    Blenda MountsCouture, Kameela Leipold Laurice 04/13/2017, 10:19 AM

## 2017-04-13 NOTE — Progress Notes (Signed)
Internal Medicine Attending  Date: 04/13/2017  Patient name: Jason Martin Medical record number: [id number] Date of birth: 07/26/1926 Age: 83 y.o. Gender: male  I saw and evaluated the patient. I reviewed the resident's note by Dr. Renaldo ReelHuang and I agree with the resident's findings and plans as documented in her progress note.  Please see my H&P dated 04/13/2017 for specifics of my evaluation, assessment, and plan from earlier in the day.

## 2017-04-13 NOTE — Progress Notes (Signed)
Paged MD regarding HR sustaining in 160s and pain control. No new orders at this time. Called report to [place], RN on 3MW.  Called pt's niece, Marchelle Folksmanda, to make her aware of transfer.

## 2017-04-13 NOTE — Care Management Note (Addendum)
Case Management Note  Patient Details  Name: Jason Martin MRN: [id number] Date of Birth: 11/17/1925  Subjective/Objective:  [facility], presents with  abd pain, chest pain,? Aspiration pna, sinus tachycardia, aki.  Transferred from 3E to SDU for chest pain, sob, fluid overload,tachy, Niece, Marchelle Folksmanda  is POA  , patient is actively expiring.                 Action/Plan: NCM will follow along with CSW for dc needs.   Expected Discharge Date:                  Expected Discharge Plan:  Assisted Living / Rest Home  In-House Referral:  Clinical Social Work  Discharge planning Services  CM Consult  Post Acute Care Choice:    Choice offered to:     DME Arranged:    DME Agency:     HH Arranged:    HH Agency:     Status of Service:  In process, will continue to follow  If discussed at Long Length of Stay Meetings, dates discussed:    Additional Comments:  Leone Havenaylor, Jethro Radke Clinton, RN 04/13/2017, 8:45 PM

## 2017-04-13 NOTE — Plan of Care (Signed)
Problem: Pain Managment: Goal: General experience of comfort will improve Outcome: Progressing One time dose of Dilaudid given. Patient resting. Denies any discomfort at this time.

## 2017-04-13 NOTE — Progress Notes (Addendum)
Paged MD regarding pt assessment, pt gurgling, states he is cold, and shivering. IV fluids turned off. Vital signs documented. Rapid response called.  MD at bedside now

## 2017-04-13 NOTE — Progress Notes (Signed)
Pt. Pain unrelieved with pain med.Pt. Stated he can not bare the pain and stated "just let me die".  On call for IMTS paged to make aware.

## 2017-04-13 NOTE — Consult Note (Signed)
WOC Nurse wound consult note Reason for Consult: pressure injury Wound type: stage 2 pressure injury with surrounding deep tissue injury Pressure Injury POA: Yes Measurement: open area is 1.5cm x 1.5cm x 0.1cm with surrounding DTI that extends 6cm x 4cm x 0 Wound bed: open area is dry and pink Drainage (amount, consistency, odor) minimal, no odor Periwound: intact  Dressing procedure/placement/frequency: Add low air loss mattress for pressure redistribution Silicone foam to protect and insulate, change every 3 days and PRN soilage.   Discussed POC with patient and bedside nurse.  Re consult if needed, will not follow at this time. Thanks  Desi Carby M.D.C. Holdings[employer]in MSN, RN,CWOCN, CNS, CWON-AP [phone])

## 2017-04-13 NOTE — Progress Notes (Signed)
PT Cancellation Note  Patient Details Name: Jason GailsHerbert Martin MRN: [id number] DOB: 02/02/1926   Cancelled Treatment:    Reason Eval/Treat Not Completed: Medical issues which prohibited therapy.  Pulses very high and O2 sats are trending low.  Will check later as time and pt condition allow.   Ivar DrapeRuth E Shrinika Blatz 04/13/2017, 8:41 AM   Samul Dadauth Totiana Everson, PT MS Acute Rehab Dept. Number: [facility] [id number] and [facility] [phone]

## 2017-04-13 NOTE — Progress Notes (Signed)
Subjective: Mr. Willcutt was unable to be interviewed this morning as he was in respiratory distress due to possible aspiration this morning.  Objective: Vital signs in last 24 hours: Vitals:   04/13/17 0943 04/13/17 0950 04/13/17 1239 04/13/17 1417  BP:  (!) 158/85 101/88   Pulse:  (!) 163 (!) 151   Resp:  (!) 37 (!) 43   Temp: (!) 101.3 F (38.5 C)  (!) 101.6 F (38.7 C) (!) 100.9 F (38.3 C)  TempSrc: Rectal  Rectal Rectal  SpO2:   90%   Weight:  59.3 kg (130 lb 11.7 oz)    Height:  6' (1.829 m)     Weight change:   Intake/Output Summary (Last 24 hours) at 04/13/17 1534 Last data filed at 04/13/17 1240  Gross per 24 hour  Intake           563.42 ml  Output              325 ml  Net           238.42 ml   Lungs: wheezes bilaterally Lab Results: CBC notable for Hgb 11.2, WBC 23.2, platelets 420 BMP notable for BUN 38 and Cr 1.45 ABG notable for pH 7.31, pCO2 45.7, pO2 68.3, bicarb 22.5   Micro Results: Recent Results (from the past 240 hour(s))  MRSA PCR Screening     Status: None   Collection Time: 05/06/2017 11:52 PM  Result Value Ref Range Status   MRSA by PCR NEGATIVE NEGATIVE Final    Comment:        The GeneXpert MRSA Assay (FDA approved for NASAL specimens only), is one component of a comprehensive MRSA colonization surveillance program. It is not intended to diagnose MRSA infection nor to guide or monitor treatment for MRSA infections.    Studies/Results: Ct Abdomen Pelvis Wo Contrast  Result Date: 05/03/2017 CLINICAL DATA:  83 year old male with history of left lower quadrant abdominal pain for the past week. Abnormal chest x-ray today. EXAM: CT CHEST, ABDOMEN AND PELVIS WITHOUT CONTRAST TECHNIQUE: Multidetector CT imaging of the chest, abdomen and pelvis was performed following the standard protocol without IV contrast. COMPARISON:  No priors. FINDINGS: CT CHEST FINDINGS Cardiovascular: Heart size is normal. There is no significant pericardial fluid,  thickening or pericardial calcification. There is aortic atherosclerosis, as well as atherosclerosis of the great vessels of the mediastinum and the coronary arteries, including calcified atherosclerotic plaque in the left main, left anterior descending, left circumflex and right coronary arteries. Calcifications of the aortic valve and mitral annulus. Mediastinum/Nodes: No pathologically enlarged mediastinal or hilar lymph nodes. Esophagus is unremarkable in appearance. No axillary lymphadenopathy. Lungs/Pleura: There is near complete collapse/consolidation of the left lower lobe, with extensive bronchiectasis within the left lower lobe, much of which is opacified by retained mucus. Widespread bronchial wall thickening, thickening of the peribronchovascular interstitium with peribronchovascular micro and macronodularity noted elsewhere in the lungs bilaterally. Background of mild to moderate centrilobular and paraseptal emphysema. Thick-walled cavity measuring approximately 2.3 x 5.7 x 4.0 cm (axial image 50 of series 5) in the posterior aspect of the left upper lobe has some dependent fluid/ debris. Musculoskeletal: Mild compression of superior endplate anteriorly at T6 with loss in 10% loss of anterior vertebral body height. Multiple old healed left-sided rib fractures. There are no aggressive appearing lytic or blastic lesions noted in the visualized portions of the skeleton. CT ABDOMEN PELVIS FINDINGS Hepatobiliary: No definite cystic or solid hepatic lesions are identified on today's noncontrast CT  examination. Tiny partially calcified gallstones lie dependently in the gallbladder. Gallbladder is moderately distended. No gallbladder wall thickening, pericholecystic fluid or surrounding inflammatory changes are noted to strongly suggest an acute cholecystitis at this time. Pancreas: No definite pancreatic mass or peripancreatic inflammatory changes are noted on today's noncontrast CT examination. Spleen:  Unremarkable. Adrenals/Urinary Tract: Unenhanced appearance of the kidneys and bilateral adrenal glands is normal. No hydroureteronephrosis. Urinary bladder is partially obscured by beam hardening artifact from the patient's left hip arthroplasty, but visualized portions of the urinary bladder are unremarkable in appearance. Stomach/Bowel: Unenhanced appearance of the stomach is normal. There is no pathologic dilatation of small bowel or colon. Numerous colonic diverticulae are noted, most evident in the sigmoid colon and descending colon, without overt surrounding inflammatory changes to strongly suggest an acute diverticulitis at this time. The appendix is not confidently identified and may be surgically absent. Regardless, there are no inflammatory changes noted adjacent to the cecum to suggest the presence of an acute appendicitis at this time. Vascular/Lymphatic: Aortic atherosclerosis, without definite aneurysm in the abdominal or pelvic vasculature. No lymphadenopathy noted in the abdomen or pelvis on today's noncontrast CT examination. Reproductive: Prostate gland and seminal vesicles are unremarkable in appearance. Other: No significant volume of ascites.  No pneumoperitoneum. Musculoskeletal: Status post left hip arthroplasty. Old healed fractures of the left superior and inferior pubic rami. There are no aggressive appearing lytic or blastic lesions noted in the visualized portions of the skeleton. IMPRESSION: 1. No acute findings in the abdomen or pelvis to account for the patient's symptoms. However, there is extensive colonic diverticulosis, particularly of the descending colon and sigmoid colon. 2. 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 At this time, this is complicated by a near complete collapse and consolidation of the left lower lobe, as well as the presence of a thick-walled cavitary area in the  posterior aspect of the left upper lobe, as detailed above. 3. Mild to moderate centrilobular and paraseptal emphysema; imaging findings suggestive of underlying COPD. 4. Aortic atherosclerosis, in addition to left main and 3 vessel coronary artery disease. 5. There are calcifications of the aortic valve and mitral annulus. Echocardiographic correlation for evaluation of potential valvular dysfunction may be warranted if clinically indicated. Aortic Atherosclerosis (ICD10-I70.0) and Emphysema (ICD10-J43.9). Electronically Signed   By: Trudie Reedaniel  Entrikin M.D.   On: 23-Dec-2016 11:08   Dg Chest 2 View  Result Date: 05/07/2017 CLINICAL DATA:  Chest pain, shortness of breath. EXAM: CHEST  2 VIEW COMPARISON:  None. FINDINGS: The cardiomediastinal silhouette is normal in size. Atherosclerotic calcification of the aortic arch. Changes of COPD with patchy increased density and atelectasis within the left lung. Increased retrocardiac density with silhouetting of the descending aorta. Patchy density in the right lower lobe. Small left pleural effusion. No acute osseous abnormality. IMPRESSION: 1. Reduced left lung volume with possible left lower lobe collapse. Recommend CT chest with contrast for further evaluation. 2. Patchy opacities throughout the left lung and within the right lower lobe, which could represent pneumonia in the proper clinical setting. 3.  Emphysema. (ICD10-J43.9) 4.  Aortic Atherosclerosis (ICD10-I70.0). Electronically Signed   By: Obie DredgeWilliam T Derry M.D.   On: 23-Dec-2016 09:52   Ct Chest Wo Contrast  Result Date: 04/29/2017 CLINICAL DATA:  83 year old male with history of left lower quadrant abdominal pain for the past week. Abnormal chest x-ray today. EXAM: CT CHEST, ABDOMEN AND PELVIS WITHOUT CONTRAST TECHNIQUE: Multidetector CT imaging  of the chest, abdomen and pelvis was performed following the standard protocol without IV contrast. COMPARISON:  No priors. FINDINGS: CT CHEST FINDINGS Cardiovascular:  Heart size is normal. There is no significant pericardial fluid, thickening or pericardial calcification. There is aortic atherosclerosis, as well as atherosclerosis of the great vessels of the mediastinum and the coronary arteries, including calcified atherosclerotic plaque in the left main, left anterior descending, left circumflex and right coronary arteries. Calcifications of the aortic valve and mitral annulus. Mediastinum/Nodes: No pathologically enlarged mediastinal or hilar lymph nodes. Esophagus is unremarkable in appearance. No axillary lymphadenopathy. Lungs/Pleura: There is near complete collapse/consolidation of the left lower lobe, with extensive bronchiectasis within the left lower lobe, much of which is opacified by retained mucus. Widespread bronchial wall thickening, thickening of the peribronchovascular interstitium with peribronchovascular micro and macronodularity noted elsewhere in the lungs bilaterally. Background of mild to moderate centrilobular and paraseptal emphysema. Thick-walled cavity measuring approximately 2.3 x 5.7 x 4.0 cm (axial image 50 of series 5) in the posterior aspect of the left upper lobe has some dependent fluid/ debris. Musculoskeletal: Mild compression of superior endplate anteriorly at T6 with loss in 10% loss of anterior vertebral body height. Multiple old healed left-sided rib fractures. There are no aggressive appearing lytic or blastic lesions noted in the visualized portions of the skeleton. CT ABDOMEN PELVIS FINDINGS Hepatobiliary: No definite cystic or solid hepatic lesions are identified on today's noncontrast CT examination. Tiny partially calcified gallstones lie dependently in the gallbladder. Gallbladder is moderately distended. No gallbladder wall thickening, pericholecystic fluid or surrounding inflammatory changes are noted to strongly suggest an acute cholecystitis at this time. Pancreas: No definite pancreatic mass or peripancreatic inflammatory  changes are noted on today's noncontrast CT examination. Spleen: Unremarkable. Adrenals/Urinary Tract: Unenhanced appearance of the kidneys and bilateral adrenal glands is normal. No hydroureteronephrosis. Urinary bladder is partially obscured by beam hardening artifact from the patient's left hip arthroplasty, but visualized portions of the urinary bladder are unremarkable in appearance. Stomach/Bowel: Unenhanced appearance of the stomach is normal. There is no pathologic dilatation of small bowel or colon. Numerous colonic diverticulae are noted, most evident in the sigmoid colon and descending colon, without overt surrounding inflammatory changes to strongly suggest an acute diverticulitis at this time. The appendix is not confidently identified and may be surgically absent. Regardless, there are no inflammatory changes noted adjacent to the cecum to suggest the presence of an acute appendicitis at this time. Vascular/Lymphatic: Aortic atherosclerosis, without definite aneurysm in the abdominal or pelvic vasculature. No lymphadenopathy noted in the abdomen or pelvis on today's noncontrast CT examination. Reproductive: Prostate gland and seminal vesicles are unremarkable in appearance. Other: No significant volume of ascites.  No pneumoperitoneum. Musculoskeletal: Status post left hip arthroplasty. Old healed fractures of the left superior and inferior pubic rami. There are no aggressive appearing lytic or blastic lesions noted in the visualized portions of the skeleton. IMPRESSION: 1. No acute findings in the abdomen or pelvis to account for the patient's symptoms. However, there is extensive colonic diverticulosis, particularly of the descending colon and sigmoid colon. 2. 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 At this time, this is complicated by a near complete collapse and consolidation of the left lower lobe, as  well as the presence of a thick-walled cavitary area in the posterior aspect of the left upper lobe, as detailed above. 3. Mild to moderate centrilobular and paraseptal emphysema; imaging findings suggestive of  underlying COPD. 4. Aortic atherosclerosis, in addition to left main and 3 vessel coronary artery disease. 5. There are calcifications of the aortic valve and mitral annulus. Echocardiographic correlation for evaluation of potential valvular dysfunction may be warranted if clinically indicated. Aortic Atherosclerosis (ICD10-I70.0) and Emphysema (ICD10-J43.9). Electronically Signed   By: Trudie Reed M.D.   On: 04/14/2017 11:08   Dg Chest Port 1 View  Result Date: 04/13/2017 CLINICAL DATA:  Respiratory distress. EXAM: PORTABLE CHEST 1 VIEW COMPARISON:  04/08/2017 FINDINGS: The heart size appears normal. Aortic atherosclerosis is identified. Chronic consolidation of the left lower lobe with extensive bronchiectasis is again noted. The cavitary lesion projecting over the left lung base is again seen and appears unchanged from 04/09/2017. Diffuse reticular and nodular interstitial opacities and bronchiectasis is noted bilaterally. IMPRESSION: 1. No change to the left lung base compared with the previous exam. 2. Chronic interstitial lung disease as before Electronically Signed   By: Signa Kell M.D.   On: 04/13/2017 09:25   Medications: I have reviewed the patient's current medications. Scheduled Meds: . bisacodyl  10 mg Rectal Daily  . enoxaparin (LOVENOX) injection  40 mg Subcutaneous Q24H  . hydrocortisone  25 mg Rectal Daily  . ipratropium-albuterol  3 mL Nebulization TID  . Melatonin  9 mg Oral QHS  . mometasone-formoterol  2 puff Inhalation BID  . pantoprazole  40 mg Oral BID  . senna-docusate  2 tablet Oral QHS  . sodium chloride flush  3 mL Intravenous Q12H  . tiotropium  18 mcg Inhalation Daily   Continuous Infusions: . sodium chloride    . sodium chloride    . [START ON 04/14/2017]  sodium chloride    . piperacillin-tazobactam (ZOSYN)  IV     PRN Meds:.sodium chloride, acetaminophen, albuterol, phenylephrine-shark liver oil-mineral oil-petrolatum, sodium chloride, sodium chloride flush Assessment/Plan: Active Problems:   Abdominal pain   Pressure injury of skin  Respiratory distress with possible aspiration: Mr. Freimark has a history of COPD with recurrent pneumonias. He has described having five pneumonias in the past five years. He has a history of aspiration, and CT scan shows changes consistent with chronic aspiration into the left lung. This morning, he experienced severe respiratory distress likely due to aspiration, as he was not NPO. He was placed on a nonrebreather mask and transferred to a step down unit. ABG showed respiratory acidosis and CXR showed unchanged chronic consolidation on left lower lobe as well as unchanged diffuse reticular and nodular opacities and bronchiectasis consistent with interstitial lung disease. Rectal temperatures today have been 100.9-101.6. Blood cultures were drawn. - broaden abx to zosyn  - keep NPO - perform swallow study when possible - continue nonrebreather - continue PRN albuterol and spiriva inhalers and duonebs - f/u on blood cultures  Abdominal pain: As of this morning, Mr. Rames has not had a bowel movement in five days. He is most likely severely constipated, although chronic mesenteric ischemia remains on the different. He was given a dose of senna yesterday as well as a dose of dilaudid last night due to reports of severe LLQ pain. He was given a dulcolax suppository at 1255 this afternoon. A rectal exam was performed to evaluate for rectal impaction, but none was noted. Soft, light brown stool was noted in the rectal vault. - start tap water enema - continue bowel regimen of dulcolax suppository 10 mg qd, senna 2 tablets qd - continue to follow CBC and BMP  Pressure ulcer: WOCN evaluating. - continue to  dress  AKI: Likely due to dehydration. Unclear what his baseline is, but his creatinine has fallen from 1.81 on admission to 1.45 this morning after volume repletion. BUN has fallen from 49 to 38. - continue NS 150 mL/hr over 12 hours  HTN: On lasix 40 mg daily at home. Lasix was discontinued due to signs of volume depletion on exam. Blood pressures have been in the mild to moderate range today (101-166/75-88). Tachycardic in the 150s-160s today due to respiratory distress. - hold lasix  This is a Psychologist, occupational Note.  The care of the patient was discussed with Dr. Renaldo Reel and the assessment and plan formulated with their assistance.  Please see their attached note for official documentation of the daily encounter.   LOS: 1 day   Shon Baton, Medical Student 04/13/2017, 3:34 PM      I have seen the patient and reviewed the daily progress note by Boris Sharper, MS III, and discussed the care of the patient with them.  See separate progress note for documentation of my findings, assessment, and plans.  Scherrie Gerlach, MD  Internal Medicine, PGY-1 04/14/2017, 11:58 AM

## 2017-04-13 NOTE — Progress Notes (Signed)
Attempted to pass NTS catheter down both nostrils & all attempts to pass catheter were unsuccessful. Attempted to deep suction pt.'s throat & pt. Refused to be suctioned. RN aware.

## 2017-04-13 NOTE — Progress Notes (Signed)
Pt. Unable to complete sentences when speaking. Gurgling noted. Continuous infusion stopped. On call for IMTS  paged to make aware.

## 2017-04-13 NOTE — Progress Notes (Signed)
Subjective: We were called to bedside early this morning due to patient in acute respiratory distress. He had increased work of breathing with coarse breath sounds. Tachycardic to 160s. O2 sats in the 80s on venturi mask and RR 32-38. He was given 1 dose of IV lasix 40mg . Placed on 100% NRB with improvement in sats to 91-94%, RR in upper 30s. ABG and CXR obtained. Patient was transferred to stepdown.  We returned to assess [name] after rounds. He continues to complain of pain in his abdomen. We gave him a dose of dilaudid and scheduled dulcolax suppository. His O2 sats were ~90-92% on nonrebreather mask. He is DNR/DNI. Scheduled bronchodilators. Patient refused NTS catheter for deep suction of patient's throat. He also continues to be tachycardic in the 140s. Febrile to 101.6.  Later in the afternoon, we had a lengthy discussion with Harrie ForemanAmanda Dancy (PA-C), patient's niece and his power of attorney. We updated her on the plan for his care and ensured that we would keep her in the loop moving forward. 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  We also returned to bedside to perform manual disimpaction. Removed small amount of soft brown stool, however, no hard stools removed.  Objective:  Vital signs in last 24 hours: Vitals:   04/13/17 0950 04/13/17 1239 04/13/17 1417 04/13/17 1556  BP: (!) 158/85 101/88    Pulse: (!) 163 (!) 151    Resp: (!) 37 (!) 43    Temp:  (!) 101.6 F (38.7 C) (!) 100.9 F (38.3 C) 99.1 F (37.3 C)  TempSrc:  Rectal Rectal Axillary  SpO2:  90%    Weight: 130 lb 11.7 oz (59.3 kg)     Height: 6' (1.829 m)      GEN: Very thin white male lying in bed in mild distress, with nonrebreather mask EYES: EOMI. Sclera anicteric. RESP: Coarse breath sounds bilaterally. Tachypneic. Increased work of breathing with some accessory muscle use. O2 sats 90-92% on nonrebreather. CV: Tachycardic. Regular  rhythm. No murmurs. No LE edema. ABD: Soft. Non-distended. Hypoactive bowel sounds. Tender to palpation in RLQ and LLQ. EXT: No edema. 2+ DP and radial pulses. NEURO: Cranial nerves II-XII grossly intact. Able to lift all four extremities against gravity. SKIN: Warm and dry. Stage 2 sacral ulcer.  ABG 7.313, CO2 45.7, O2 68.3, bicarb 22.5 UA neg  BMET    Component Value Date/Time   NA 137 04/13/2017 0152   K 3.5 04/13/2017 0152   CL 101 04/13/2017 0152   CO2 25 04/13/2017 0152   GLUCOSE 107 (H) 04/13/2017 0152   BUN 38 (H) 04/13/2017 0152   CREATININE 1.45 (H) 04/13/2017 0152   CALCIUM 9.1 04/13/2017 0152   GFRNONAA 41 (L) 04/13/2017 0152   GFRAA 48 (L) 04/13/2017 0152   CBC    Component Value Date/Time   WBC 23.2 (H) 04/13/2017 0152   RBC 3.95 (L) 04/13/2017 0152   HGB 11.2 (L) 04/13/2017 0152   HCT 34.2 (L) 04/13/2017 0152   PLT 420 (H) 04/13/2017 0152   MCV 86.6 04/13/2017 0152   MCH 28.4 04/13/2017 0152   MCHC 32.7 04/13/2017 0152   RDW 14.1 04/13/2017 0152   LYMPHSABS 3.3 08-Apr-2017 0910   MONOABS 1.4 (H) 08-Apr-2017 0910   EOSABS 0.1 08-Apr-2017 0910   BASOSABS 0.0 08-Apr-2017 0910   Assessment/Plan:  Active Problems:   Abdominal pain   Pressure injury of  skin  # Abdominal pain with intermittent radiation to chest Etiology: constipation vs chronic mesenteric ischemia. CT with moderate amount of stool. No evidence of acute findings in abdomen or pelvis. Patient hasn't had a bowel movement in 5 days. Further, patient with fairly extensive atherosclerosis in aorta, great vessels, and coronary arteries. Patient does note indigestion/burning symptoms. Manual disimpaction with no impacted stool, only soft brown stool. - Dulcolax suppository, Senna - tap water enema - NPO until swallow study eval complete - NS infusion 132mL/hr for 12 hrs, then 130mL/hr - PPI - PT/OT eval when able  # Acute respiratory distress in setting of chronic aspiration and underlying  COPD Etiology: acute aspiration PNA vs chemical pneumonitis Repeat CXR with possible increased opacities in RLL and LLL. On 2-3L O2 at home. Patient does report coughing while drinking liquids. Febrile to 101.6, WBC up to 23.2, tachycardic to 140s. ABG 7.31/45/68. Continues to be tachypneic. We are concerned than patient may tire out. Patient is DNR/DNI. - Continue nonrebreather for O2 sats 88-92% - Changing Unasyn to Zosyn to broaden spectrum - Speech/swallow eval when able - NPO for now - BCx x 2 (after receiving Unasyn) - UA neg - Can consider prednisone if suspect gastric pneumonitis. Will hold off on this for now. - suctioning as able - Scheduled duonebs, dulera, spiriva - albuterol PRN - tylenol suppository - transferred to SDU - CBC in AM  # AKI Likely due to dehydration. Cr improved to 1.45 today from 1.81, BUN 49->38 (unknown baseline Cr). Dry on physical exam. - NS infusion 129mL/hr for 12 hrs, then 135mL/hr - BMET in AM  # Stage 2 sacral ulcer Niece reports that it had healed 2 weeks ago and now has opened up again. - Wound care consult  # HTN BP stable - Continue to monitor BP  Dispo: Anticipated discharge pending clinical course.  Scherrie Gerlach, MD  Internal Medicine, PGY-1 04/13/2017, 5:05 PM Pager: Demetrius Charity [phone]

## 2017-04-13 NOTE — H&P (Signed)
Internal Medicine Attending Admission Note Date: 04/13/2017  Patient name: Jason Martin Medical record number: [id number] Date of birth: [birth year] Age: 83 y.o. Gender: male  I saw and evaluated the patient. I reviewed the resident's note and I agree with the resident's findings and plan as documented in the resident's note.  Chief Complaint(s): Abdominal pain 1 week  History - key components related to admission:  Jason Martin is an 83 year old man with oxygen dependent chronic obstructive pulmonary disease, asbestosis, chronic aspiration complicated by multiple pneumonias, hypertension, and peptic ulcer disease with a history of gastric ulcers who was in his usual state of health until approximately one week prior to admission when he developed the acute onset of abdominal pain that radiated to the center of his chest intermittently. Of note, he's had a three-week history of decreased appetite and a 5 day history of constipation. When specifically asked if he has abdominal pain after eating he says this does not occur. He has had an 8 pound weight loss over the last month but when we subsequently spoke to his niece he had previously been gaining weight. Finally, he admits that he often coughs after drinking or eating. With the progressive abdominal pain he was brought to the emergency department where he underwent a CT scan of the abdomen and pelvis which was only notable for diverticulosis without diverticulitis, large stool burden, and extensive abdominal vascular calcifications. A CT scan of the chest was notable for chronic scarring and bronchiectasis of the left lower lobe, particularly the superior subsegment, and bilateral tree-in-bud changes. He was therefore admitted to the internal medicine teaching service for further evaluation and care.  When seen on rounds the morning following admission we were paged emergently to the bedside. He became progressively more hypoxic, tachypneic, and had  gurgling sounds. Upon our arrival he was placed on a partial nonrebreather mask with saturations of approximately 93%. He was tachypneic to the upper 30s and had bilateral bronchial breath sounds on examination with an increased work of breathing. Acute aspiration was felt to be the cause of his acute on chronic decompensation and he was transferred to the stepdown unit for closer care. A chest x-ray obtained just after the event suggested early inflammatory changes in the basilar segments of the right lobe lower lobe that were new from the day before.  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 She provided [place] with additional history some of which I have included above. Given this acute decompensation the most relevant piece of information was that he is known to chronically aspirate and had some speech therapy recently. It was reconfirmed that he prefers to be a DO NOT RESUSCITATE.  Physical Exam - key components related to admission:  Vitals:   04/13/17 0950 04/13/17 1239 04/13/17 1417 04/13/17 1556  BP: (!) 158/85 101/88    Pulse: (!) 163 (!) 151    Resp: (!) 37 (!) 43    Temp:  (!) 101.6 F (38.7 C) (!) 100.9 F (38.3 C) 99.1 F (37.3 C)  TempSrc:  Rectal Rectal Axillary  SpO2:  90%    Weight: 130 lb 11.7 oz (59.3 kg)     Height: 6' (1.829 m)      Gen.: Well-developed, thin, chronically ill-appearing, elderly man lying in bed with his head at 40 upright on a partial nonrebreather mask in mild respiratory  distress secondary to tachypnea but with only a minimal increase in work of breathing. Lungs: Bibasilar bronchial breath sounds with inspiratory crackles and no appreciable wheezes. Heart: Tachycardic, regular rhythm. Abdomen: Soft, tenderness to palpation in the left lower quadrant, without guarding or rebound. Rectal: Mildly decreased rectal tone,  no masses appreciated. There was no hard stool in the rectal vault although we were able to extract some brown soft stool. I do not believe he was impacted based on my examination. Skin: Bruising on the upper extremities, decreased skin turgor. Stage II sacral ulcer.  Lab results:  Basic Metabolic Panel:  Recent Labs  16/10/96 0910 04/13/17 0152  NA 135 137  K 4.3 3.5  CL 95* 101  CO2 30 25  GLUCOSE 114* 107*  BUN 49* 38*  CREATININE 1.81* 1.45*  CALCIUM 10.0 9.1   Liver Function Tests:  Recent Labs  05/02/2017 0910  AST 15  ALT 7*  ALKPHOS 68  BILITOT 1.2  PROT 7.6  ALBUMIN 3.0*    Recent Labs  04/22/2017 0910  LIPASE 27   CBC:  Recent Labs  05/01/2017 0910 04/13/17 0152  WBC 19.8* 23.2*  NEUTROABS 15.0*  --   HGB 12.6* 11.2*  HCT 38.1* 34.2*  MCV 86.8 86.6  PLT 424* 420*   Urinalysis:  Clear, yellow, specific gravity 1.012, pH 5.0, nitrite negative, leukocytes negative.  Misc. Labs:  Blood cultures 2 pending ABG on 100% nonrebreather: 7.31/46/68  Imaging results:   PA and lateral chest x-ray (04/24/2017): Personally reviewed. Left lower lobe atelectasis with volume loss and patchy right basilar infiltrates with preservation of the right hemidiaphragm.  AP portable chest x-ray (04/13/2017): Personally reviewed. Possible slight worsening in left basilar infiltrate. Early silhouetting of the right hemidiaphragm suggestive of a new patchy inflammatory process in the basilar segments of the right lower lobe.  Chest CT: Personally reviewed. Consolidation with volume loss of the left lower lobe associated with bronchiectasis and possible round atelectasis. The majority of this is in the superior segment of the left lower lobe is 1 follows the segmental bronchi off the left main stem bronchus. There is also the tree-in-bud appearance in both lungs suggesting an acute inflammatory process. There is background paraseptal and centrilobular emphysema.  Abdominal CT:  Personally reviewed. Extensive vascular calcifications throughout the abdomen. Large stool burden that extends through the rectum. No other acute findings.  Other results:  EKG: Normal sinus rhythm at 94 bpm, normal axis, first-degree AV block, no significant Q waves, no LVH by voltage, early R wave progression, nonspecific ST-T changes. There are no comparisons immediately available.  Assessment & Plan by Problem:  Mr. Beale is an 83 year old man with oxygen dependent chronic obstructive pulmonary disease, asbestosis, chronic aspiration complicated by multiple pneumonias, hypertension, and peptic ulcer disease with a history of gastric ulcers who was in his usual state of health until approximately one week prior to admission when he developed the acute onset of abdominal pain that radiated to the center of his chest intermittently. The cause of this abdominal pain remains unclear. Our working diagnosis is constipation given the large stool burden. He is not impacted on examination. Given his extensive calcifications and recent weight loss we initially were thinking about mesenteric ischemia. Fortunately, there was a period recently where he was gaining weight and he specifically denies worsening pain with eating. In addition, he does not have an anion gap metabolic acidosis and is only mildly acidotic by ABG, some of which is respiratory in nature. This  too would not be consistent with acute mesenteric ischemia. Given his age and the focality of his pain on examination one could worry about diverticulitis but, despite extensive diverticulosis, this was not seen on CT scan and the physical exam would not be entirely consistent with acute diverticulitis.  With regards to his acute pulmonary issues it is very consistent with an acute aspiration. It should be noted that he has chronic aspiration and the superior segment of the left lower lobe has associated bronchiectasis with scarring that would also be  consistent. He likely has asbestos, as was mentioned by his niece. Thus, some of the pleural process in the left base may be round atelectasis, although I believe most of this is related to chronic aspiration. The tree-in-bud picture seen on CT imaging is consistent with an acute inflammatory process such as aspiration, but given the underlying COPD, MAI is not an unreasonable diagnosis within the differential. That being said, MAI does not cause an acute decompensation as we saw today. Ideally, [name] would be intubated for his respiratory decompensation. Since he is a DO NOT RESUSCITATE this is not an option and BiPAP would not be helpful for an acute aspiration with infection, particularly with continued aspiration.  His sinus tachycardia is likely related to his underlying infection, pulmonary decompensation, and dehydration related to markedly increased insensible losses given the tachypnea.  1) Abdominal pain: At this point our thoughts are this is related to chronic constipation with a large stool burden. He has been manually disimpacted, given a suppository, and will be given a tap water enema. We will perform serial abdominal examinations and carefully watch for evidence of mesenteric ischemia.  2) Acute on chronic aspiration: He is currently nothing by mouth and we have delayed the swallowing evaluation given his acute decompensation. We will reassess his swallowing once he has stabilized. He has been transferred to the stepdown unit and remains on a 100% partial nonrebreather. He is given bronchodilators for his underlying obstructive disease in hopes of decreasing the work of breathing associated with this. His antibiotics have been broadened to Zosyn which should cover the flora associated with an aspiration from the upper airway.  3) Sinus tachycardia: This is multifactorial as noted in the assessment. We will therefore treat the underlying infection, minimize aspiration as much as  possible, and gently volume resuscitate given his likely dehydration from increased insensible losses. This is being done gently so as not to over hydrate and push him into pulmonary edema given his current tenuous oxygenation status.  4) Disposition: Pending his response to the supportive measures above. We will be sure to keep his niece updated on a daily basis.

## 2017-04-13 NOTE — Progress Notes (Signed)
ABG attempted x2 without success. RN aware. RT will continue to monitor.

## 2017-04-14 ENCOUNTER — Inpatient Hospital Stay (HOSPITAL_COMMUNITY): Payer: Medicare Other

## 2017-04-14 DIAGNOSIS — E875 Hyperkalemia: Secondary | ICD-10-CM

## 2017-04-14 DIAGNOSIS — K55059 Acute (reversible) ischemia of intestine, part and extent unspecified: Secondary | ICD-10-CM

## 2017-04-14 DIAGNOSIS — N170 Acute kidney failure with tubular necrosis: Secondary | ICD-10-CM

## 2017-04-14 LAB — BLOOD GAS, ARTERIAL
ACID-BASE DEFICIT: 11.8 mmol/L — AB (ref 0.0–2.0)
Bicarbonate: 15.7 mmol/L — ABNORMAL LOW (ref 20.0–28.0)
DRAWN BY: 317771
FIO2: 100
O2 Saturation: 97.2 %
PCO2 ART: 49 mmHg — AB (ref 32.0–48.0)
PH ART: 7.133 — AB (ref 7.350–7.450)
Patient temperature: 98.6
pO2, Arterial: 122 mmHg — ABNORMAL HIGH (ref 83.0–108.0)

## 2017-04-14 LAB — BASIC METABOLIC PANEL
Anion gap: 21 — ABNORMAL HIGH (ref 5–15)
Anion gap: 18 — ABNORMAL HIGH (ref 5–15)
Anion gap: 12 (ref 5–15)
Anion gap: 14 (ref 5–15)
BUN: 58 mg/dL — AB (ref 6–20)
BUN: 60 mg/dL — AB (ref 6–20)
BUN: 63 mg/dL — AB (ref 6–20)
BUN: 67 mg/dL — ABNORMAL HIGH (ref 6–20)
CALCIUM: 8.7 mg/dL — AB (ref 8.9–10.3)
CALCIUM: 8 mg/dL — AB (ref 8.9–10.3)
CALCIUM: 7.5 mg/dL — AB (ref 8.9–10.3)
CHLORIDE: 112 mmol/L — AB (ref 101–111)
CHLORIDE: 111 mmol/L (ref 101–111)
CO2: 15 mmol/L — AB (ref 22–32)
CO2: 16 mmol/L — ABNORMAL LOW (ref 22–32)
CO2: 18 mmol/L — ABNORMAL LOW (ref 22–32)
CO2: 16 mmol/L — ABNORMAL LOW (ref 22–32)
CREATININE: 3.25 mg/dL — AB (ref 0.61–1.24)
CREATININE: 3.22 mg/dL — AB (ref 0.61–1.24)
CREATININE: 3.44 mg/dL — AB (ref 0.61–1.24)
Calcium: 7.3 mg/dL — ABNORMAL LOW (ref 8.9–10.3)
Chloride: 107 mmol/L (ref 101–111)
Chloride: 109 mmol/L (ref 101–111)
Creatinine, Ser: 3 mg/dL — ABNORMAL HIGH (ref 0.61–1.24)
GFR calc Af Amer: 20 mL/min — ABNORMAL LOW (ref >60)
GFR calc Af Amer: 17 mL/min — ABNORMAL LOW (ref >60)
GFR calc non Af Amer: 16 mL/min — ABNORMAL LOW (ref >60)
GFR calc non Af Amer: 16 mL/min — ABNORMAL LOW (ref >60)
GFR, EST AFRICAN AMERICAN: 18 mL/min — AB (ref >60)
GFR, EST AFRICAN AMERICAN: 18 mL/min — AB (ref >60)
GFR, EST NON AFRICAN AMERICAN: 17 mL/min — AB (ref >60)
GFR, EST NON AFRICAN AMERICAN: 15 mL/min — AB (ref >60)
GLUCOSE: 68 mg/dL (ref 65–99)
GLUCOSE: 118 mg/dL — AB (ref 65–99)
Glucose, Bld: 75 mg/dL (ref 65–99)
Glucose, Bld: 85 mg/dL (ref 65–99)
POTASSIUM: 6.2 mmol/L — AB (ref 3.5–5.1)
POTASSIUM: 5.7 mmol/L — AB (ref 3.5–5.1)
Potassium: 6.2 mmol/L — ABNORMAL HIGH (ref 3.5–5.1)
Potassium: 6.4 mmol/L (ref 3.5–5.1)
SODIUM: 143 mmol/L (ref 135–145)
SODIUM: 142 mmol/L (ref 135–145)
SODIUM: 141 mmol/L (ref 135–145)
Sodium: 143 mmol/L (ref 135–145)

## 2017-04-14 LAB — CBC
HEMATOCRIT: 41.1 % (ref 39.0–52.0)
Hemoglobin: 12.9 g/dL — ABNORMAL LOW (ref 13.0–17.0)
MCH: 28.6 pg (ref 26.0–34.0)
MCHC: 31.4 g/dL (ref 30.0–36.0)
MCV: 91.1 fL (ref 78.0–100.0)
PLATELETS: 353 10*3/uL (ref 150–400)
RBC: 4.51 MIL/uL (ref 4.22–5.81)
RDW: 14.8 % (ref 11.5–15.5)
WBC: 37.7 10*3/uL — ABNORMAL HIGH (ref 4.0–10.5)

## 2017-04-14 LAB — LACTIC ACID, PLASMA
LACTIC ACID, VENOUS: 6.2 mmol/L — AB (ref 0.5–1.9)
LACTIC ACID, VENOUS: 3.6 mmol/L — AB (ref 0.5–1.9)
Lactic Acid, Venous: 7.2 mmol/L (ref 0.5–1.9)

## 2017-04-14 LAB — GLUCOSE, CAPILLARY
Glucose-Capillary: 72 mg/dL (ref 65–99)
Glucose-Capillary: 65 mg/dL (ref 65–99)
Glucose-Capillary: 121 mg/dL — ABNORMAL HIGH (ref 65–99)

## 2017-04-14 MED ORDER — DIPHENHYDRAMINE HCL 12.5 MG/5ML PO ELIX: 12.5000 mg | ORAL_SOLUTION | Freq: Four times a day (QID) | ORAL | Status: DC | PRN

## 2017-04-14 MED ORDER — SODIUM CHLORIDE 0.9 % IV BOLUS (SEPSIS)
250.0000 mL | Freq: Once | INTRAVENOUS | Status: AC
  Administered 2017-04-14: 250 mL via INTRAVENOUS

## 2017-04-14 MED ORDER — PIPERACILLIN-TAZOBACTAM IN DEX 2-0.25 GM/50ML IV SOLN
2.2500 g | Freq: Three times a day (TID) | INTRAVENOUS | Status: DC
  Administered 2017-04-14 – 2017-04-15 (×4): 2.25 g via INTRAVENOUS
  Filled 2017-04-14 (×6): qty 50

## 2017-04-14 MED ORDER — ENOXAPARIN SODIUM 30 MG/0.3ML ~~LOC~~ SOLN
30.0000 mg | SUBCUTANEOUS | Status: DC
  Administered 2017-04-14: 30 mg via SUBCUTANEOUS
  Filled 2017-04-14: qty 0.3

## 2017-04-14 MED ORDER — SODIUM CHLORIDE 0.9 % IV SOLN
INTRAVENOUS | Status: AC
  Administered 2017-04-14: 08:00:00 via INTRAVENOUS

## 2017-04-14 MED ORDER — DIPHENHYDRAMINE HCL 50 MG/ML IJ SOLN: 12.5000 mg | Freq: Four times a day (QID) | INTRAMUSCULAR | Status: DC | PRN

## 2017-04-14 MED ORDER — SODIUM CHLORIDE 0.9 % IV SOLN
1.0000 g | Freq: Once | INTRAVENOUS | Status: AC
  Administered 2017-04-14: 1 g via INTRAVENOUS
  Filled 2017-04-14: qty 10

## 2017-04-14 MED ORDER — SODIUM CHLORIDE 0.9 % IV BOLUS (SEPSIS)
500.0000 mL | Freq: Once | INTRAVENOUS | Status: AC
  Administered 2017-04-14: 500 mL via INTRAVENOUS

## 2017-04-14 MED ORDER — DEXTROSE 50 % IV SOLN
INTRAVENOUS | Status: AC
  Administered 2017-04-14: 50 mL
  Filled 2017-04-14: qty 50

## 2017-04-14 MED ORDER — NALOXONE HCL 0.4 MG/ML IJ SOLN: 0.4000 mg | INTRAMUSCULAR | Status: DC | PRN

## 2017-04-14 MED ORDER — SODIUM CHLORIDE 0.9% FLUSH: 9.0000 mL | INTRAVENOUS | Status: DC | PRN

## 2017-04-14 MED ORDER — HYDROMORPHONE HCL 1 MG/ML IJ SOLN
2.0000 mg | Freq: Once | INTRAMUSCULAR | Status: AC
  Administered 2017-04-14: 2 mg via INTRAVENOUS
  Filled 2017-04-14: qty 2

## 2017-04-14 MED ORDER — HYDROMORPHONE HCL 1 MG/ML IJ SOLN: 0.5000 mg | INTRAMUSCULAR | Status: DC | PRN

## 2017-04-14 MED ORDER — HYDROMORPHONE HCL 1 MG/ML IJ SOLN
1.0000 mg | INTRAMUSCULAR | Status: DC | PRN
  Administered 2017-04-14 (×2): 1 mg via INTRAVENOUS
  Filled 2017-04-14 (×2): qty 1

## 2017-04-14 MED ORDER — INSULIN ASPART 100 UNIT/ML ~~LOC~~ SOLN
10.0000 [IU] | Freq: Once | SUBCUTANEOUS | Status: AC
  Administered 2017-04-14: 10 [IU] via SUBCUTANEOUS

## 2017-04-14 MED ORDER — ONDANSETRON HCL 4 MG/2ML IJ SOLN: 4.0000 mg | Freq: Four times a day (QID) | INTRAMUSCULAR | Status: DC | PRN

## 2017-04-14 MED ORDER — SODIUM CHLORIDE 0.9 % IV SOLN
INTRAVENOUS | Status: DC
  Administered 2017-04-14 – 2017-04-15 (×5): via INTRAVENOUS

## 2017-04-14 MED ORDER — HYDROMORPHONE 1 MG/ML IV SOLN
INTRAVENOUS | Status: DC
  Filled 2017-04-14: qty 25

## 2017-04-14 NOTE — Progress Notes (Signed)
[name] was found to be more hypotensive and less responsive along with morning labs concerning for worsened metabolic acidosis with a lactic acid of 7.2. On my initial exam, he seemed less interactive than on previous evaluation in the evening. He had cool extremities and was unable to localize pain on abdominal examination. I ordered 2 500mL boluses. A STAT ABG resulted with pH of 7.133 and  Blood pressure improved to 101/58. and afterwards on repeat assessment he was mentating well with ability to verbalize and indicate pain in his upper quadrants and left lower quadrant. I ordered a STAT portable abdominal xray due to concern for bowel ischemia or infarction as the mechanism for this acute change and anion gap metabolic acidosis.

## 2017-04-14 NOTE — Progress Notes (Signed)
Internal Medicine Attending  Date: 04/14/2017  Patient name: Jason Martin Medical record number: [id number] Date of birth: 11/04/1925 Age: 83 y.o. Gender: male  I saw and evaluated the patient. I reviewed the resident's note by Dr. Dimple Caseyice and I agree with the resident's findings and plans as documented in his progress note.  Dr. Dimple Caseyice paged me early this morning when Jason Martin was hypotensive, relatively bradycardic, and had an anion gap metabolic acidosis with a lactic acid greater than 7 and a concomitant respiratory acidosis. I evaluated Jason Martin at the bedside at that time and he was alert, responsive, and answering questions. He noted severe abdominal pain mainly in the epigastric area. Physical examination revealed guarding and tenderness in the left lower quadrant, left upper quadrant, and right upper quadrant. He had minimal bowel sounds. He did not have rebound at that time. He again reaffirmed he did not want to be intubated and was not interested in any surgery. The reason for the latter was that we are now concerned that the process that brought him to the hospital was, in fact, intestinal ischemia. 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 As his blood pressure slowly decreased overnight because we had not kept up with his insensible losses the perfusion to the bowels decreased resulting in further ischemia versus an infarction. With the anion gap metabolic acidosis, lactic acidosis, and severe abdominal pain, bowel ischemia is the working diagnosis until proven otherwise. He initially responded very well to IV fluids and IV Dilaudid. His niece and DURABLE POWER OF ATTORNEY Marchelle Folksmanda was called and updated on his situation and she also presented to the bedside to talk with him. He made it clear to her that he again did not want to be intubated and did not want surgery and wanted to be  kept comfortable. As he was in significant pain he was given a dose of IV dilaudid, was given a bolus of IV fluids at 250 mL per hour over 2 hours, a PCA pump was ordered so that he could provide his own analgesia, and the maintenance rate of the IV fluids would be further adjusted upwards after he received half a liter of normal saline. An abdominal x-ray with a lateral decubitus film revealed dilation of the transverse colon to 7.4 cm and no free air. There continue to be a significant stool burden. I'm concerned this is evidence of ischemic bowel related to his chronic critical stenoses. Jason Martin subsequently dropped his blood pressure once again and was relatively bradycardic in the 80s. The IV fluids were opened wide and he was reevaluated. At this point he was unresponsive. His breathing was a little heavier but he appeared comfortable. On examination he was nontender and was no longer guarding. His abdomen was soft but remained without bowel sounds. His niece was again called with an update on his status. We will continue to support his blood pressure as best we can with IV fluids and make sure he remains comfortable, but I am afraid his prognosis is grim at this point and am convinced his presentation with abdominal pain was secondary to mesenteric ischemia.

## 2017-04-14 NOTE — Plan of Care (Signed)
Problem: Education: Goal: Knowledge of [facility] Education information/materials will improve Outcome: Progressing POC reviewed with pt.   

## 2017-04-14 NOTE — Progress Notes (Addendum)
BP 50/39 and pt. less responsive verbally; O2 sat 100% NRBM resp. 30; paged Dr. Frances FurbishWinfrey; calling rapid response nurse, then Dr. Dimple Caseyice and Dr. Frances FurbishWinfrey came to unit to see pt.; order for 500cc bolus NS given 0518; lactic acid 7.2 and Drs. informed of this. Niece Marchelle Folksmanda called and Dr. Dimple Caseyice talked to her around [id number].

## 2017-04-14 NOTE — Progress Notes (Signed)
CRITICAL VALUE ALERT  Critical Value: ABG                         Ph 7.133                         PCO2 49                          PO2 122                          BICARB 15.7  Date & Time Notied: 04/14/17 0603  Provider Notified: Dr. Dimple Caseyice Orders Received/Actions taken: yes

## 2017-04-14 NOTE — Progress Notes (Signed)
Orders received from Dr. Jeannette CorpusJennifer Haung IMTS for 10 unit of Novolog, 1 amp of D50, CBG, In and out cath once and calcium gluconate 1 gm once. For potassium of 6.4.

## 2017-04-14 NOTE — Progress Notes (Signed)
OT Cancellation Note  Patient Details Name: Jason Martin MRN: [id number] DOB: 10/01/1925   Cancelled Treatment:    Reason Eval/Treat Not Completed: Medical issues which prohibited therapy. Pt with respiratory issues.  [name], Kyren Knick Eva [phone] 04/14/2017, 9:24 AM

## 2017-04-14 NOTE — Progress Notes (Signed)
TC Dr. Frances FurbishWinfrey- pt. rested after they left and pt. wanting pain med -Dilaudid IV and BP low 82/42; tried manual BP and unable to get; and BP has dropped to 74 systolic; unable to give Dilaudid with decrease BP and he agreed; will try Tylenol supp.

## 2017-04-14 NOTE — Progress Notes (Addendum)
Subjective: We were called to bedside early this morning as Jason Martin was more hypotensive and less responsive. Lactic acid of 7.2 and K of 6.2. EKG without peaked T waves. sBP in the 50s-80s, as well as O2 sats down to 60s. NS bolus given with sBP increasing to 101/58. ABG with 7.1/49/122. Official KUB read without evidence of obstruction or ileus or free air. However, his transverse colon appears markedly dilated (up to 7.4cm) and we are concerned for megacolon secondary to bowel ischemia. Patient is tender to palpation in abdomen diffusely with guarding. Also with increased work of breathing with some accessory muscle use. O2 sats now up to >90% and BP in 80s-90s. Increased fluids to 2950ml/hr NS infusion for 2 hours, then to 17350ml/hr. Patient reports that the one-time 0.5mg  dilaudid doses he was receiving before were not helpful. Ordered PCA to provide better control of his abdominal pain. Loading dose of 2mg  dilaudid IV. After administration of loading dose, patient appears comfortable and no longer complaining of pain.  Niece/durable power of attorney, Jason Folksmanda, has been updated regarding the tenuous state that he is in. Re-established that patient continues to be DNR/DNI and does not want surgery. Niece's step[facility] may be able to make it to bedside later this afternoon/evening.  Objective:  Vital signs in last 24 hours: Vitals:   04/13/17 2350 04/14/17 0344 04/14/17 0736 04/14/17 0800  BP: 108/62 (!) 82/54  (!) 54/37  Pulse: 73 (!) 54    Resp: (!) 36 (!) 35    Temp: (!) 97.3 F (36.3 C) (!) 96.8 F (36 C)  (!) 96.3 F (35.7 C)  TempSrc: Axillary Axillary  Axillary  SpO2: 95% 94% 100%   Weight:      Height:       GEN: Very thin white male lying in bed in with nonrebreather mask. Minimally responsive. RESP: Coarse breath sounds bilaterally. ?Agonal breathing. Increased work of breathing with some accessory muscle use. O2 sats 90-92% on nonrebreather CV: Normal  rate. Regular rhythm. No murmurs. No LE edema. ABD: Soft. Mildly distended. Hypoactive bowel sounds. After administration of loading dose dilaudid, patient no longer tender/guarding. EXT: No edema. Cool and dry. Pale. NEURO: Minimally responsive after loading dose of dilaudid.  SKIN: Cool and dry. Pale. Stage 2 sacral ulcer.  ABG 7.13/49/122  BMET    Component Value Date/Time   NA 143 04/14/2017 0357   K 6.2 (H) 04/14/2017 0357   CL 107 04/14/2017 0357   CO2 15 (L) 04/14/2017 0357   GLUCOSE 68 04/14/2017 0357   BUN 58 (H) 04/14/2017 0357   CREATININE 3.00 (H) 04/14/2017 0357   CALCIUM 8.7 (L) 04/14/2017 0357   GFRNONAA 17 (L) 04/14/2017 0357   GFRAA 20 (L) 04/14/2017 0357   Repeat K 6.2  CBC    Component Value Date/Time   WBC 37.7 (H) 04/14/2017 0357   RBC 4.51 04/14/2017 0357   HGB 12.9 (L) 04/14/2017 0357   HCT 41.1 04/14/2017 0357   PLT 353 04/14/2017 0357   MCV 91.1 04/14/2017 0357   MCH 28.6 04/14/2017 0357   MCHC 31.4 04/14/2017 0357   RDW 14.8 04/14/2017 0357   LYMPHSABS 3.3 04/27/2017 0910   MONOABS 1.4 (H) 04/10/2017 0910   EOSABS 0.1 05/04/2017 0910   BASOSABS 0.0 04/29/2017 0910   KUB 8/8 Aortic atherosclerosis. No evidence of bowel obstruction or ileus. No definite free air is noted.  Assessment/Plan:  Active Problems:   Abdominal pain   Pressure injury of  skin   Aspiration pneumonia of both lower lobes (HCC)   AKI (acute kidney injury) (HCC)   Abdominal pain, left lower quadrant  Jason Martin is an 83yo male with history of COPD (on 2-3L O2 at home), chronic aspiration, asbestos exposure, PUD with history of gastric ulcers, and HTN who presents to the ED from facility with 1 week history of abdominal pain and intermittent chest pain. His hospital stay has been complicated by an acute aspiration event requiring 100% nonrebreather mask, and now concern for bowel ischemia related to hypotension and potentially stenotic mesenteric vasculature. Patient's  status continues to decline over the last 24 hours. Patient's niece and durable power of attorney, Jason Martin, has been updated and is aware of the patient's poor prognosis. It was re-established that patient is DNR/DNI and that he does not want surgery. We are providing comfort measures.  # Anion gap metabolic acidosis with respiratory acidosis Bicarb 16. AG 18. ABG 7.13/49/122. Lactic acid 7.2 -> 6.2. Concern for bowel ischemia or infarction secondary to hypotension in the setting of stenotic mesenteric vasculature (patient has extensive vascular calcifications on CT). Cr 1.45 -> 3.0 -> 3.2. K 6.2, repeat K 6.2. sBP 50s-80s. Official KUB read without evidence of obstruction or ileus or free air. However, transverse colon appears markedly dilated (up to 7.4cm) and we are concerned for megacolon secondary to bowel ischemia. - NPO - NS infusion, wide open - continue to monitor BP --- consider fluid bolus if BP <90 - comfort measures with IV dilaudid as needed - Patient continues to be DNR/DNI --- will monitor closely and keep niece updated of any changes - repeat lactic acid and BMET in 3 hours  # Hyperkalemia K 3.5 to 6.2 today (repeat 6.2). Received potassium yesterday.  Concern for cell death in the setting of possible bowel ischemia. EKG without peaked T waves. - Continue to monitor. - Repeat BMET in 3 hours  # Acute respiratory distress in setting of chronic aspiration and underlying COPD Etiology: acute aspiration PNA vs chemical pneumonitis ABG 7.13/49/122. Patient was tachypneic, now RR in the 20s. Continues on 100% nonbreather. Some increased work of breathing. Patient is DNR/DNI. - Continue nonrebreather - Continue to monitor O2 sats - Continue zosyn - NPO - f/u BCx (drawn after initiation of Unasyn) - Scheduled duonebs, dulera, spiriva - albuterol PRN - tylenol suppository  # Stage 2 sacral ulcer - Wound care consulted, appreciate their help  # History of  HTN Hypotensive to the 50s-90s over the last 24 hours. - Holding home anti-hypertensives - Continue to monitor BP - NS infusion, wide open - continue to monitor BP --- consider fluid bolus if BP <90  Overall prognosis is very poor. Anticipate decline and death in the next 24 hours.  Scherrie Gerlach, MD  Internal Medicine, PGY-1 04/14/2017, 8:30 AM Pager: Demetrius Charity [phone]

## 2017-04-14 NOTE — Progress Notes (Signed)
Around 3pm today, the nurse informed me that Jason Martin's K was 6.4 (up from 6.2) and lactic acid was 3.6 (down from 6.2). Patient had just asked for more pain medicine and had some labored breathing. O2 sats >95% on 100% nonrebreather. sBP in 90s. NS running at 16550ml/hr. We gave him 10u novolog + 1 amp D50, as well as 1g calcium gluconate over 2-123min. Will recheck BMET at 8pm tonight.  Nurse also informed me that he has not urinated all day, even with all the fluids. Bladder scan showed 260cc of urine. Ordered in and out cath for patient's comfort.  I updated patient's niece and durable power of attorney, Marchelle Folksmanda, regarding these events.  Scherrie GerlachJennifer Makhiya Coburn, MD Internal Medicine, PGY-1 04/14/2017. 5:15pm

## 2017-04-14 NOTE — Progress Notes (Addendum)
BP 82/47 at beginning of shift, 71/51 and now 66/43; pt. nonverbal and not opening eyes; resp. slight labored and O2 sat stable; paged Dr. Frances FurbishWinfrey re: BP. 2232 Dr. Frances FurbishWinfrey in to see pt. and orders given; Dr. Dimple Caseyice in to see pt. 2255. BP 52/41 and Dr.'s aware- orders given. Dr.'s aware of no urinary output- no foley at this time.

## 2017-04-14 NOTE — Progress Notes (Signed)
Pt. restless and rubbing abdomen; whispered that he wants to see Dr. And he wants pain med that he can push the button; paged Dr. Frances FurbishWinfrey and RTC [phone] and stated that he will be to see pt.; pt. informed. Dr. Frances FurbishWinfrey and Dr. Dimple Caseyice in to see pt. at 0145. Dr. Frances FurbishWinfrey wanted a bladder scan.

## 2017-04-14 NOTE — Progress Notes (Signed)
Jason RalphsVivian, NT bladder scanned pt. with 243cc and Dr. Frances FurbishWinfrey informed at 0220.

## 2017-04-14 NOTE — Progress Notes (Signed)
PT Cancellation Note  Patient Details Name: Jason Martin MRN: [id number] DOB: 07/15/1926   Cancelled Treatment:    Reason Eval/Treat Not Completed: Medical issues which prohibited therapy. Pt with respiratory issues.   Angelina OkCary W Maycok 04/14/2017, 9:12 AM Skip Mayerary Rania Prothero PT [phone]

## 2017-04-14 NOTE — Progress Notes (Signed)
SLP Cancellation Note  Patient Details Name: Jason Martin MRN: [id number] DOB: 12/18/1925   Cancelled treatment:       Reason Eval/Treat Not Completed: Medical issues which prohibited therapy. Pt not appropriate for SLP assessment at this time. Will follow for plan of care and further needs.   Harlon DittyBonnie Kye Hedden, [place] CCC-SLP [phone]  Claudine MoutonDeBlois, Fradel Baldonado Caroline 04/14/2017, 9:58 AM

## 2017-04-14 NOTE — Progress Notes (Signed)
PHARMACY NOTE:  ANTIMICROBIAL RENAL DOSAGE ADJUSTMENT  Current antimicrobial regimen includes a mismatch between antimicrobial dosage and estimated renal function.  As per policy approved by the Pharmacy & Therapeutics and Medical Executive Committees, the antimicrobial dosage will be adjusted accordingly.  Current antimicrobial dosage:  Zosyn 3.375 gm every 8 hours (4 hour infusion)  Indication: Aspiration Pneumonia   Renal Function:  Estimated Creatinine Clearance: 13.2 mL/min (A) (by C-G formula based on SCr of 3.25 mg/dL (H)). []      On intermittent HD, scheduled: []      On CRRT    Antimicrobial dosage has been changed to: Zosyn 2.25 gm every 8 hours (30 minute infusion)   Additional comments: F/U discontinuation as appropriate    Thank you for allowing pharmacy to be a part of this patient's care.  Sharin MonsEmily Sinclair, PharmD PGY2 Infectious Diseases Pharmacy Resident Pager: [phone]  04/14/2017 10:13 AM

## 2017-04-14 NOTE — Progress Notes (Addendum)
Paged Dr. Frances FurbishWinfrey- pt. c/o's a lot pain in abdomen and rubbing area; and pt. whispered to RN that he wants to die;  RTC and orders given.

## 2017-04-14 NOTE — Progress Notes (Signed)
Subjective: Jason Martin was unable to be interviewed this morning as he was in acute respiratory distress and abdominal pain. He nonverbally noted that he was in pain and that his abdomen was very uncomfortable.  Objective: Vital signs in last 24 hours: Vitals:   04/14/17 0800 04/14/17 0830 04/14/17 0900 04/14/17 1000  BP: (!) 54/37 (!) 51/37 (!) 61/41 (!) 88/66  Pulse:  (!) 25 79 92  Resp:  (!) 23 (!) 23 (!) 31  Temp: (!) 96.3 F (35.7 C)     TempSrc: Axillary     SpO2:  97% 99% 94%  Weight:      Height:       Weight change: -4.204 kg (-9 lb 4.3 oz)  Intake/Output Summary (Last 24 hours) at 04/14/17 1117 Last data filed at 04/14/17 0700  Gross per 24 hour  Intake           2945.5 ml  Output                0 ml  Net           2945.5 ml   General appearance: severe distress Abdomen: abnormal findings:  guarding and marked tenderness in the LLQ and in the upper abdomen Lab Results: BMP (1610): notable for anion gap of 21, K+ to 6.2, CO2 15, BUN 58, Cr 3.0, Ca 8.7 Lactic acid (0357): 7.2 ABG: pH 7.133, pCO2 49.0, pO2 122, bicarb 15.7 BMP (9604): notable for anion gap of 18, K+ 6.2, CO2 16, BUN 60, Cr 3.25, Ca 8.0 Lactic acid (5409): 6.2  Micro Results: Recent Results (from the past 240 hour(s))  MRSA PCR Screening     Status: None   Collection Time: 2017/04/16 11:52 PM  Result Value Ref Range Status   MRSA by PCR NEGATIVE NEGATIVE Final    Comment:        The GeneXpert MRSA Assay (FDA approved for NASAL specimens only), is one component of a comprehensive MRSA colonization surveillance program. It is not intended to diagnose MRSA infection nor to guide or monitor treatment for MRSA infections.    Studies/Results: Dg Chest Port 1 View  Result Date: 04/13/2017 CLINICAL DATA:  Respiratory distress. EXAM: PORTABLE CHEST 1 VIEW COMPARISON:  04-16-2017 FINDINGS: The heart size appears normal. Aortic atherosclerosis is identified. Chronic consolidation of the left lower  lobe with extensive bronchiectasis is again noted. The cavitary lesion projecting over the left lung base is again seen and appears unchanged from 2017/04/16. Diffuse reticular and nodular interstitial opacities and bronchiectasis is noted bilaterally. IMPRESSION: 1. No change to the left lung base compared with the previous exam. 2. Chronic interstitial lung disease as before Electronically Signed   By: Signa Kell M.D.   On: 04/13/2017 09:25   Dg Abd 2 Views  Result Date: 04/14/2017 CLINICAL DATA:  Abdominal pain. EXAM: ABDOMEN - 2 VIEW COMPARISON:  CT scan of 04/16/17. FINDINGS: The bowel gas pattern is normal. Stool is noted in the transverse colon and rectum. There is no evidence of free air. No radio-opaque calculi or other significant radiographic abnormality is seen. Atherosclerosis of abdominal aorta is noted. IMPRESSION: Aortic atherosclerosis. No evidence of bowel obstruction or ileus. No definite free air is noted. Electronically Signed   By: Lupita Raider, M.D.   On: 04/14/2017 08:41   Medications: I have reviewed the patient's current medications. Scheduled Meds: . bisacodyl  10 mg Rectal Daily  . enoxaparin (LOVENOX) injection  30 mg Subcutaneous Q24H  .  hydrocortisone  25 mg Rectal Daily  . ipratropium  0.5 mg Nebulization TID  . levalbuterol  0.63 mg Nebulization TID  . Melatonin  9 mg Oral QHS  . mometasone-formoterol  2 puff Inhalation BID  . pantoprazole  40 mg Oral BID  . senna-docusate  2 tablet Oral QHS  . sodium chloride flush  3 mL Intravenous Q12H  . tiotropium  18 mcg Inhalation Daily   Continuous Infusions: . sodium chloride    . sodium chloride 150 mL/hr at 04/14/17 1111  . piperacillin-tazobactam (ZOSYN)  IV 2.25 g (04/14/17 1110)   PRN Meds:.sodium chloride, acetaminophen, HYDROmorphone (DILAUDID) injection, levalbuterol, phenylephrine-shark liver oil-mineral oil-petrolatum, sodium chloride, sodium chloride flush Assessment/Plan: Active Problems:    Abdominal pain   Pressure injury of skin   Aspiration pneumonia of both lower lobes (HCC)   AKI (acute kidney injury) (HCC)   Abdominal pain, left lower quadrant  Respiratory distress with possible aspiration: Jason Martin has a history of COPD with recurrent pneumonias. He has described having five pneumonias in the past five years. He has a history of aspiration, and CT scan shows changes consistent with chronic aspiration into the left lung. Overnight he experienced severe respiratory distress accompanied by tachypnea, hypotension and mild tachycardia. An ABG showed metabolic acidosis and respiratory acidosis concurrently. He experienced severe abdominal pain overnight as well. When asked about whether he wanted any interventions for this at all, he shook his head. His family member was contacted to discuss goals of care, and the decision was made to keep him comfortable as he dies. - administer fluids 250 mL over 2 hours, if remains hypotensive, run fluids wide open - administer dilaudid 1 mg every 2 hours PRN - continue zosyn  - keep NPO - continue nonrebreather - continue PRN albuterol and spiriva inhalers and duonebs - f/u on blood cultures  Abdominal pain: As of this morning, Jason Martin has not had a bowel movement in six days. It was thought that he was severely constipated and that his abdominal pain was coming from that source. However, his intense pain this morning is likely more consistent with mesenteric ischemia as his lactic acid is increased and he has an anion gap metabolic acidosis. He has been given dilaudid 0.5 mg, then 2 mg, then 1 mg every two hours to help ease his pain. An abdominal X-ray and cross-table study were done this morning, and they showed a dilated transverse colon but no signs of blockage, ileus or free air in the abdomen. - continue to follow up on BMP and lactic acid levels  This is a Psychologist, occupationalMedical Student Note.  The care of the patient was discussed with Dr. Renaldo ReelHuang  and the assessment and plan formulated with their assistance.  Please see their attached note for official documentation of the daily encounter.   LOS: 2 days   Shon BatonParker, William F, Medical Student 04/14/2017, 11:17 AM      I have seen the patient and reviewed the daily progress note by Boris SharperWill Parker, MS III, and discussed the care of the patient with them.  See separate progress note for documentation of my findings, assessment, and plans.  Scherrie GerlachHuang, Khyron Garno, MD  Internal Medicine, PGY-1 04/14/2017, 11:17 AM

## 2017-04-14 NOTE — Plan of Care (Signed)
Problem: Pain Managment: Goal: General experience of comfort will improve Outcome: Not Progressing Pt. with pain throughout the shift and Dr. aware and orders given

## 2017-04-14 NOTE — Plan of Care (Signed)
Problem: Nutrition: Goal: Adequate nutrition will be maintained Outcome: Not Progressing Patient remains NPO at this time.

## 2017-04-14 NOTE — Progress Notes (Signed)
CRITICAL VALUE ALERT  Critical Value:  Potassium 6.4 Lactic Acid 3.6  Date & Time Notied:  04/14/17 1500  Provider Notified: Dr. Jeannette CorpusJennifer Haung IMTS  Orders Received/Actions taken: yes

## 2017-04-15 DIAGNOSIS — K551 Chronic vascular disorders of intestine: Secondary | ICD-10-CM

## 2017-04-15 DIAGNOSIS — K55059 Acute (reversible) ischemia of intestine, part and extent unspecified: Secondary | ICD-10-CM

## 2017-04-18 LAB — CULTURE, BLOOD (ROUTINE X 2)
Culture: NO GROWTH
Culture: NO GROWTH
SPECIAL REQUESTS: ADEQUATE
Special Requests: ADEQUATE

## 2017-05-08 NOTE — Progress Notes (Addendum)
TC to niece Marchelle Folksmanda and informed of pt.'s status- BP, resp.- agonal, orientation, etc.

## 2017-05-08 NOTE — Discharge Summary (Signed)
  Name: Jason GailsHerbert Martin MRN: [id number] DOB: 02/21/1926 83 y.o.  Date of Admission: 04/29/2017  8:49 AM Date of Discharge: 04/22/2017 Attending Physician: Doneen PoissonKlima, Lawrence, MD  Discharge Diagnosis: Active Problems:   Abdominal pain   Pressure injury of skin   Aspiration pneumonia of both lower lobes (HCC)   AKI (acute kidney injury) (HCC)   Abdominal pain, left lower quadrant   Acute mesenteric ischemia (HCC)   Acute renal failure with tubular necrosis (HCC)   Hyperkalemia   Mesenteric ischemia due to arterial insufficiency (HCC)  1. Mesenteric ischemia due to arterial insufficiency 2. Aspiration pneumonia of both lower lobes  Cause of death: Acute on chronic mesenteric ischemia Time of death: 4:45pm, 04/22/2017  Disposition and follow-up:   [name] [facility] expired condition.    Hospital Course: [name] presented on 8/6 with abdominal pain. CT with no evidence of acute findings in abdomen or pelvis. On the morning of 8/7, he became progressively more hypoxic and tachypneic, likely due to acute on chronic aspiration in setting of underlying lung disease. He was transferred to the stepdown unit for closer monitoring. On 8/8, he was found to be more hypotensive and less responsive with worsened acidemia. Studies included lactic acid of 7.2, K of 6.2, ABG of 7.3/45/68. Abdominal x-ray with dilation of transverse colon to 7.4cm, concerning for bowel ischemia secondary to hypoperfusion with the drop in his blood pressure. His niece and durable power of attorney, Harrie Foremanmanda Dancy, was updated throughout his entire hospital stay and reinforced the patient's wishes to not have surgery and to be DNR/DNI with comfort measures. We continued to provide supportive care, but he progressively became more hypothermic, bradycardic, and hypotensive despite fluid administration. [name] died at 4:45pm on 05/04/2017.  Signed: Scherrie GerlachHuang, Yony Roulston, MD  Internal  Medicine, PGY-1 04/29/2017, 5:01 PM

## 2017-05-08 NOTE — Progress Notes (Signed)
SLP Cancellation Note  Patient Details Name: Jason Martin MRN: [id number] DOB: 10/20/1925   Cancelled treatment:       Reason Eval/Treat Not Completed: Medical issues which prohibited therapy. Will sign off   [name], [name] 04/10/2017, 8:25 AM

## 2017-05-08 NOTE — Progress Notes (Signed)
Subjective: BMET at 8pm last night with K at 5.7, Cr at 3.4. BCx still pending, still on Zosyn. Not responsive on exam. BP not responding to fluid boluses. Becoming more hypothermic (now 92.4 from 96.1)  Objective:  Vital signs in last 24 hours: Vitals:   May 07, 2017 0010 07-May-2017 0400 May 07, 2017 0728 2017/05/07 0909  BP: (!) 52/36 (!) 58/32 (!) 55/37   Pulse: 81 72    Resp: (!) 21 20    Temp: (!) 96.1 F (35.6 C) (!) 92.4 F (33.6 C)    TempSrc: Axillary Axillary    SpO2: 100% 99%  100%  Weight:      Height:       GEN: Very thin white male lying in bed in with nonrebreather mask. Minimally responsive. HEENT: Fixed dilated pupils RESP: Some crackles in left lung base; right lung fields clear ?Agonal breathing. Increased work of breathing with some accessory muscle use as well as forced expiration. O2 sats >95% on nonrebreather CV: Normal rate. Regular rhythm. No murmurs. Diffuse soft tissue edema ABD: Soft. Mildly distended. Nontender. Positive bowel sounds. EXT: Diffuse soft tissue edema. Cool and dry. Pale. NEURO: Minimally responsive. Fixed dilated pupils. Does not awaken to pain. SKIN: Cool and dry. Pale. Arms with mottled skin. Stage 2 sacral ulcer.  BMET    Component Value Date/Time   NA 141 04/14/2017 2036   K 5.7 (H) 04/14/2017 2036   CL 111 04/14/2017 2036   CO2 16 (L) 04/14/2017 2036   GLUCOSE 118 (H) 04/14/2017 2036   BUN 67 (H) 04/14/2017 2036   CREATININE 3.44 (H) 04/14/2017 2036   CALCIUM 7.3 (L) 04/14/2017 2036   GFRNONAA 15 (L) 04/14/2017 2036   GFRAA 17 (L) 04/14/2017 2036   Assessment/Plan:  Active Problems:   Abdominal pain   Pressure injury of skin   Aspiration pneumonia of both lower lobes (HCC)   AKI (acute kidney injury) (HCC)   Abdominal pain, left lower quadrant   Acute mesenteric ischemia (HCC)   Acute renal failure with tubular necrosis (HCC)   Hyperkalemia  Jason Martin is an 83yo male with history of COPD (on 2-3L O2 at home), chronic  aspiration, asbestos exposure, PUD with history of gastric ulcers, and HTN who presents to the ED from facility with 1 week history of abdominal pain and intermittent chest pain. His hospital stay has been complicated by an acute aspiration event requiring 100% nonrebreather mask, and now concern for bowel ischemia related to hypotension and potentially stenotic mesenteric vasculature. Patient's status continues to decline over the last 24 hours. Patient's niece and durable power of attorney, Jason Martin, has been updated and is aware of the patient's poor prognosis. She has "guarded hope" but "realistic expectations". It was re-established that patient is DNR/DNI and that he does not want surgery. We are providing comfort measures.  # Anion gap metabolic acidosis with respiratory acidosis Concern for bowel ischemia or infarction secondary to hypotension in the setting of stenotic mesenteric vasculature (patient has extensive vascular calcifications on CT). sBP 50s-80s, not responding to fluid boluses. Becoming more hypothermic (92.4). Official KUB read without evidence of obstruction or ileus or free air. However, transverse colon appears markedly dilated (up to 7.4cm) and we are concerned for megacolon secondary to bowel ischemia. - NPO - continue to monitor BP --- consider fluid bolus if BP <90 - comfort measures with IV dilaudid as needed - Patient continues to be DNR/DNI --- will monitor closely and keep niece updated of any changes - NS  infusion 150 ml/hr --- if O2 sats <90%, consider backing off on fluids  # Hyperkalemia K 5.7 last night, s/p insulin, D50, and calcium gluconate. Concern for cell death in the setting of possible bowel ischemia. - Continue to monitor.  # Acute respiratory distress in setting of chronic aspiration and underlying COPD Etiology: acute aspiration PNA vs chemical pneumonitis Patient was tachypneic, some increased work of breathing, now RR in the 20s on 100%  nonbreather. Surprisingly, sats have stayed >95%. Patient is DNR/DNI. - Continue nonrebreather - Continue to monitor O2 sats --- if sats <90%, consider backing off on fluids - Continue zosyn - NPO - BCx NGTD (drawn after initiation of Unasyn) - Scheduled duonebs, dulera, spiriva - albuterol PRN - tylenol suppository  # Stage 2 sacral ulcer - Wound care following  # History of HTN Hypotensive to the 50s-90s over the last 24 hours. BP not responding to fluid boluses - Holding home anti-hypertensives - Continue to monitor BP - NS infusion 15250ml/hr - continue to monitor BP --- consider fluid bolus if BP <90  Overall prognosis is very poor. Anticipate decline and death soon. Niece/durable power of attorney is aware. Will continue to monitor closely.  Jason GerlachHuang, Jason Stoffers, MD  Internal Medicine, PGY-1 04/23/2017, 11:12 AM P [phone]

## 2017-05-08 NOTE — Progress Notes (Signed)
Telemetry monitor asystole. This nurse went into room patient without pulse/respirations. Patient expired at 1645. Family at bedside. Notified Dr. Karma GreaserBoswell (IMTS).

## 2017-05-08 NOTE — Progress Notes (Signed)
Subjective: [name] was unresponsive to verbal and painful stimuli this morning.  Objective: Vital signs in last 24 hours: Vitals:   04/16/2017 0010 2017/04/16 0400 04-16-17 0728 April 16, 2017 0909  BP: (!) 52/36 (!) 58/32 (!) 55/37   Pulse: 81 72    Resp: (!) 21 20    Temp: (!) 96.1 F (35.6 C) (!) 92.4 F (33.6 C)    TempSrc: Axillary Axillary    SpO2: 100% 99%  100%  Weight:      Height:       Weight change:   Intake/Output Summary (Last 24 hours) at 16-Apr-2017 1151 Last data filed at 04/14/17 1859  Gross per 24 hour  Intake             1280 ml  Output              275 ml  Net             1005 ml   General appearance: severe distress Lungs: wheezes bilaterally Heart: regular rate and rhythm, S1, S2 normal, no murmur, click, rub or gallop Abdomen: soft, non-tender; bowel sounds normal; no masses,  no organomegaly Extremities: edema generalized (anasarca) Lab Results: BMP notable for potassium 5.7, CO2 16, glucose 118, BUN 67, creatinine 3.44, calcium 7.3 and anion gap 14  Micro Results: Recent Results (from the past 240 hour(s))  MRSA PCR Screening     Status: None   Collection Time: 04/20/2017 11:52 PM  Result Value Ref Range Status   MRSA by PCR NEGATIVE NEGATIVE Final    Comment:        The GeneXpert MRSA Assay (FDA approved for NASAL specimens only), is one component of a comprehensive MRSA colonization surveillance program. It is not intended to diagnose MRSA infection nor to guide or monitor treatment for MRSA infections.   Culture, blood (Routine X 2) w Reflex to ID Panel     Status: None (Preliminary result)   Collection Time: 04/13/17  2:46 PM  Result Value Ref Range Status   Specimen Description BLOOD LEFT ANTECUBITAL  Final   Special Requests   Final    BOTTLES DRAWN AEROBIC AND ANAEROBIC Blood Culture adequate volume   Culture NO GROWTH 1 DAY  Final   Report Status PENDING  Incomplete  Culture, blood (Routine X 2) w Reflex to ID Panel     Status:  None (Preliminary result)   Collection Time: 04/13/17  2:47 PM  Result Value Ref Range Status   Specimen Description BLOOD RIGHT ANTECUBITAL  Final   Special Requests   Final    BOTTLES DRAWN AEROBIC AND ANAEROBIC Blood Culture adequate volume   Culture NO GROWTH 1 DAY  Final   Report Status PENDING  Incomplete   Studies/Results: Dg Abd 2 Views  Result Date: 04/14/2017 CLINICAL DATA:  Abdominal pain. EXAM: ABDOMEN - 2 VIEW COMPARISON:  CT scan of April 12, 2017. FINDINGS: The bowel gas pattern is normal. Stool is noted in the transverse colon and rectum. There is no evidence of free air. No radio-opaque calculi or other significant radiographic abnormality is seen. Atherosclerosis of abdominal aorta is noted. IMPRESSION: Aortic atherosclerosis. No evidence of bowel obstruction or ileus. No definite free air is noted. Electronically Signed   By: Lupita Raider, M.D.   On: 04/14/2017 08:41   Medications: I have reviewed the patient's current medications. Scheduled Meds: . bisacodyl  10 mg Rectal Daily  . enoxaparin (LOVENOX) injection  30 mg Subcutaneous Q24H  . hydrocortisone  25 mg Rectal Daily  . ipratropium  0.5 mg Nebulization TID  . levalbuterol  0.63 mg Nebulization TID  . Melatonin  9 mg Oral QHS  . mometasone-formoterol  2 puff Inhalation BID  . pantoprazole  40 mg Oral BID  . senna-docusate  2 tablet Oral QHS  . sodium chloride flush  3 mL Intravenous Q12H  . tiotropium  18 mcg Inhalation Daily   Continuous Infusions: . sodium chloride    . sodium chloride 150 mL/hr at 05-10-17 0138  . piperacillin-tazobactam (ZOSYN)  IV 2.25 g (May 10, 2017 0243)   PRN Meds:.sodium chloride, acetaminophen, HYDROmorphone (DILAUDID) injection, levalbuterol, phenylephrine-shark liver oil-mineral oil-petrolatum, sodium chloride, sodium chloride flush Assessment/Plan: Active Problems:   Abdominal pain   Pressure injury of skin   Aspiration pneumonia of both lower lobes (HCC)   AKI (acute kidney  injury) (HCC)   Abdominal pain, left lower quadrant   Acute mesenteric ischemia (HCC)   Acute renal failure with tubular necrosis (HCC)   Hyperkalemia   [name] is an 83 year old with a past medical history of COPD with recurrent pneumonia, chronic aspiration and HTN who presented to the ED with one week of left lower quadrant abdominal pain that radiated to the chest intermittently. His course has been complicated by a potential aspiration event leading to acute respiratory distress. He has been placed on 100% nonrebreather and has become less responsive over the last 36 hours.  Respiratory distress with possible aspiration: [name] has a history of COPD with recurrent pneumonias. He has described having five pneumonias in the past five years. He has a history of aspiration, and CT scan shows changes consistent with chronic aspiration into the left lung. Over the past two days, he has experienced severe respiratory distress accompanied by tachypnea, hypotension and mild tachycardia. An ABG showed metabolic acidosis and respiratory acidosis concurrently. He experienced severe abdominal pain on 8/7 and yesterday as well. When asked about whether he wanted any interventions for this at all, he shook his head. His family member was contacted to discuss goals of care, and the decision was made to keep him comfortable as he dies. This morning, his blood pressures have not been responding to fluids, with systolic blood pressures in the 50s to 80s. He has become more hyperthermic, last measurement was 92.42F. His sats have stayed greater than 95% on 100% O2 nonrebreather. - continue fluids - administer dilaudid 1 mg every 2 hours PRN - continue zosyn - keep NPO - continue nonrebreather - continue PRN albuterol and spiriva inhalers and duonebs  Abdominal pain: As of this morning, [name] has not had a bowel movement in seven days. It was thought that he was severely constipated and that his  abdominal pain was coming from that source. However, his intense pain yesterday morning is likely more consistent with mesenteric ischemia as his lactic acid is increased and he has an anion gap metabolic acidosis. He has been given dilaudid 0.5 mg, then 2 mg, then 1 mg every two hours to help ease his pain. An abdominal X-ray and cross-table study were done yesterday morning, and they showed a dilated transverse colon but no signs of blockage, ileus or free air in the abdomen. He has become less responsive, and so has not complained of any abdominal pain or grimaced in response to exam. - continue to monitor  This is a Psychologist, occupational Note.  The care of the patient was discussed with Dr. Renaldo Reel and the assessment and plan formulated with  their assistance.  Please see their attached note for official documentation of the daily encounter.   LOS: 3 days   Shon BatonParker, Nishanth Mccaughan F, Medical Student 05/01/2017, 11:51 AM

## 2017-05-08 NOTE — Progress Notes (Signed)
OT Cancellation Note and Discharge  Patient Details Name: Jason Martin MRN: [id number] DOB: 02/12/1926   Cancelled Treatment:    Reason Eval/Treat Not Completed: Medical issues which prohibited therapy. Per chart pt it states that pt appears to be actively in dying process. We will sign off for now, please re-order as appropriate.  [name], [name] [phone] 05/01/2017, 7:54 AM

## 2017-05-08 NOTE — Plan of Care (Signed)
Problem: Education: Goal: Knowledge of [facility] Education information/materials will improve Outcome: Not Progressing Pt. unresponsive- care still explained to pt.

## 2017-05-08 NOTE — Progress Notes (Signed)
While rounding, RNs updated me on patient's status.  Patient has been hypotensive and hypothermic.  RNs wanted clarity on disposition and plan of care for patient.  Patient's family have been forthcoming will helping the medical team care for the patient, patient is a DNR, and currently is presenting like he is actively dying.  I paged the IMTS Resident to ask for the plan of care for this patient was, we [facility] and once the family and daytime IMTS MDs were present, a more definitive plan of care ie: comfort care could be initiated.   Patient did received fluid boluses tonight and BP has not responded to fluids. Patient is mottled, labored on NRB but sats are maintaining.   Plan: Comfort care transition >  ?  Will help as needed, call RR RN.  End time 0500

## 2017-05-08 NOTE — Progress Notes (Signed)
Internal Medicine Attending  Date: 04/12/2017  Patient name: Jason Martin Medical record number: [id number] Date of birth: 04/07/1926 Age: 83 y.o. Gender: male  I saw and evaluated the patient. I reviewed the resident's note by Dr. Renaldo ReelHuang and I agree with the resident's findings and plans as documented in her progress note.  When seen on rounds this morning [name] remained unresponsive. His systolic blood pressures have been in the 50s despite fluids, and surprisingly his O2 saturations have been 100% on a nonrebreather, in spite of virtually no urine output. Abdominal exam remains soft and nontender, likely related to his unresponsiveness. Bowel sounds are normal. He is developing some early anasarca. We are continuing to support [name] maximally, but within the parameters that have been set by both he and his DURABLE POWER OF ATTORNEY by avoiding intubation, invasive procedures, and surgery. We will continue with the fluids, antibiotics, and supportive care, but I believe he is beyond the point of no return and contend his prognosis remains grim and death is imminent.

## 2017-05-08 NOTE — Progress Notes (Signed)
Dr. Dimple Caseyice in to see pt.

## 2017-05-08 NOTE — Progress Notes (Signed)
PT Cancellation Note  Patient Details Name: Jason Martin MRN: [id number] DOB: 03/12/1926   Cancelled Treatment:    Reason Eval/Treat Not Completed: Medical issues which prohibited therapy. Will sign off.   Angelina OkCary W Maycok 05/01/2017, 11:51 AM Skip Mayerary Elita Dame PT [phone]

## 2017-05-08 DEATH — deceased

## 2018-05-02 IMAGING — CT CT ABD-PELV W/O CM
2 of 5 series · 12 of 46 positions shown, 14 images · non-contrast
Comparison: No priors.

CLINICAL DATA: 89-year-old male with history of left lower quadrant
abdominal pain for the past week. Abnormal chest x-ray today.

EXAM:
CT CHEST, ABDOMEN AND PELVIS WITHOUT CONTRAST
TECHNIQUE: Multidetector CT imaging of the chest, abdomen and pelvis was
performed following the standard protocol without IV contrast.

[Series 6: cap w/o 3.0 mm st cor · coronal · non-contrast · 0.78mm/px · 3 of 76 slices shown]
[im 26/76  soft-tissue]
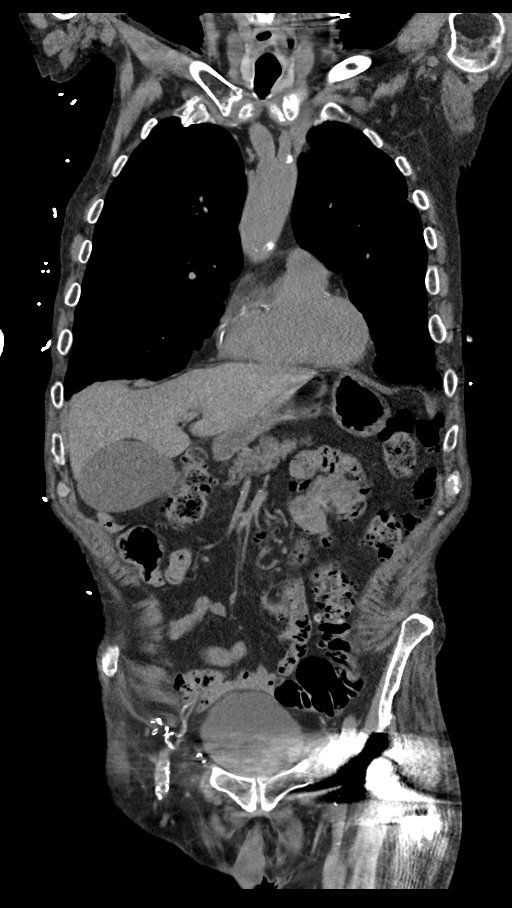
[im 34/76  soft-tissue]
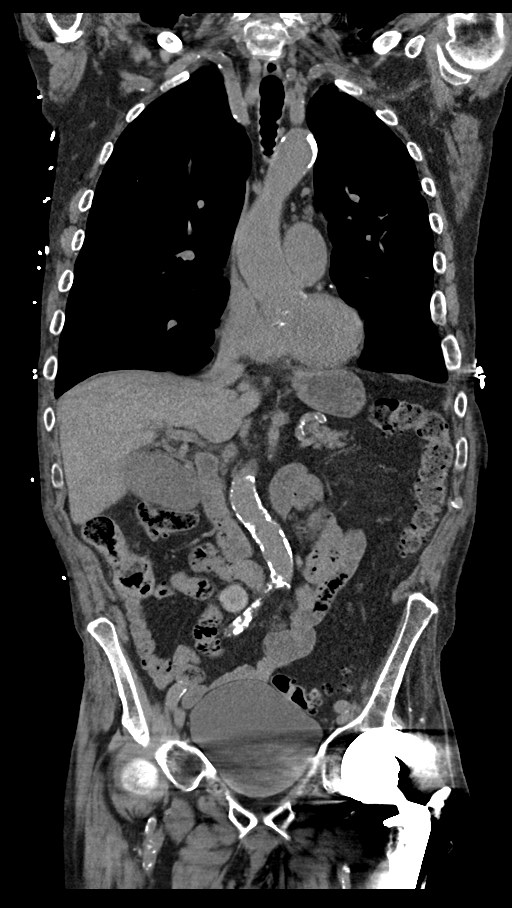
[im 42/76  soft-tissue]
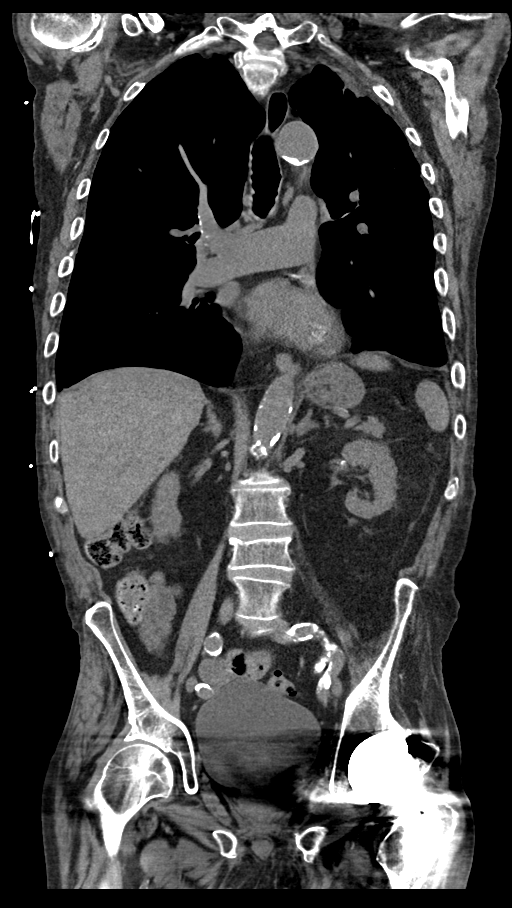

[Series 9: cap w/o 2.0 mm st · axial · non-contrast · 0.80mm/px · z∈[-942,-352]mm · 9 of 339 slices shown, 11 images]
[im 22/339  soft-tissue]
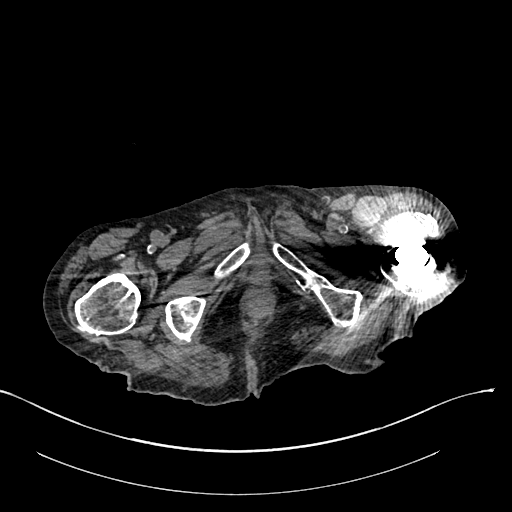
[im 22/339  bone]
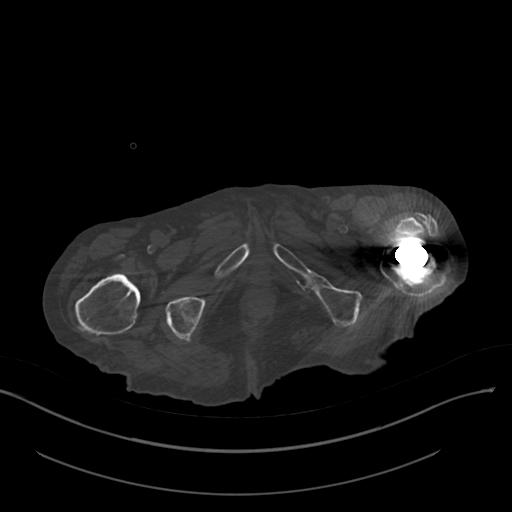
[im 64/339  soft-tissue]
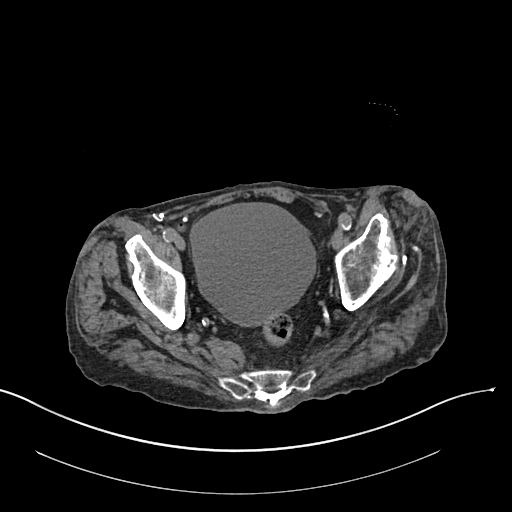
[im 106/339  soft-tissue]
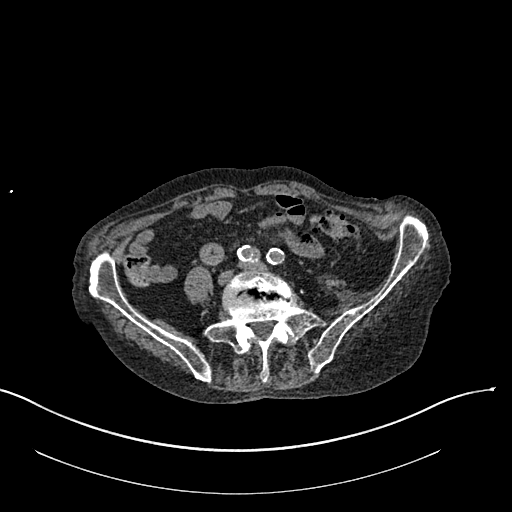
[im 127/339  soft-tissue]
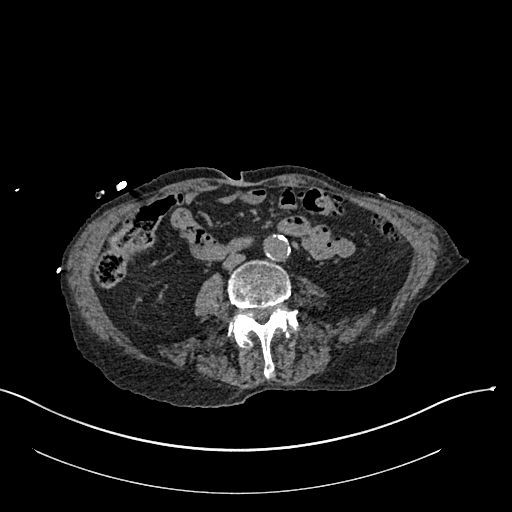
[im 170/339  soft-tissue]
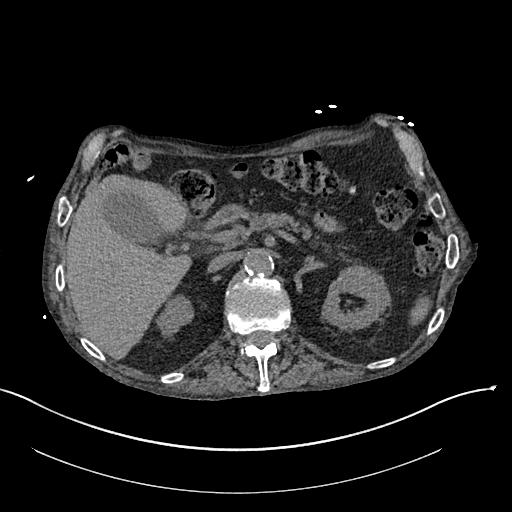
[im 212/339  soft-tissue]
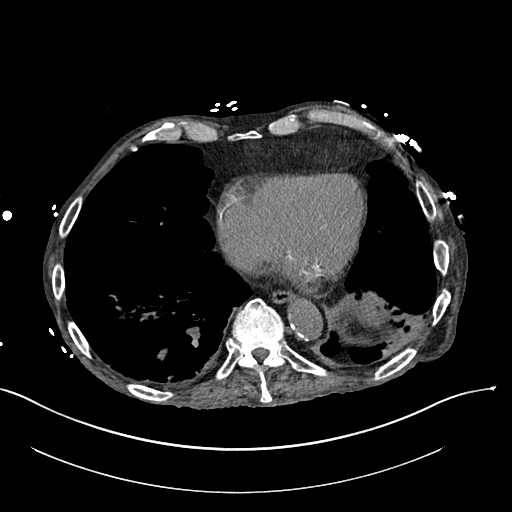
[im 233/339  soft-tissue]
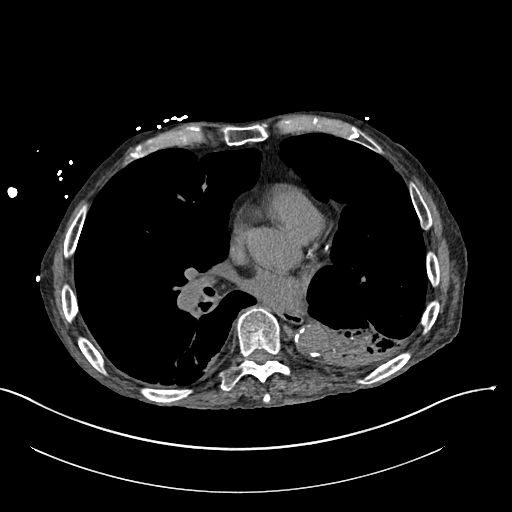
[im 275/339  soft-tissue]
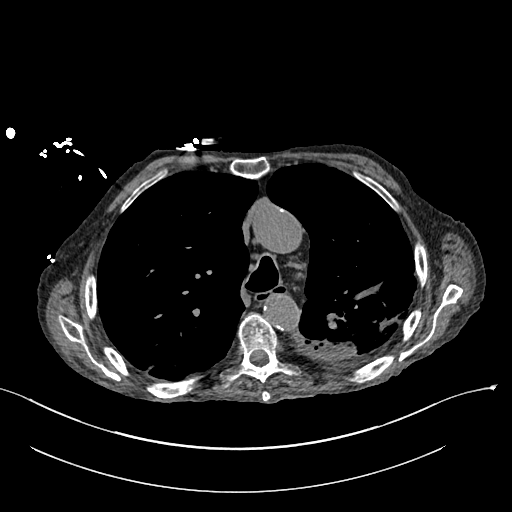
[im 317/339  soft-tissue]
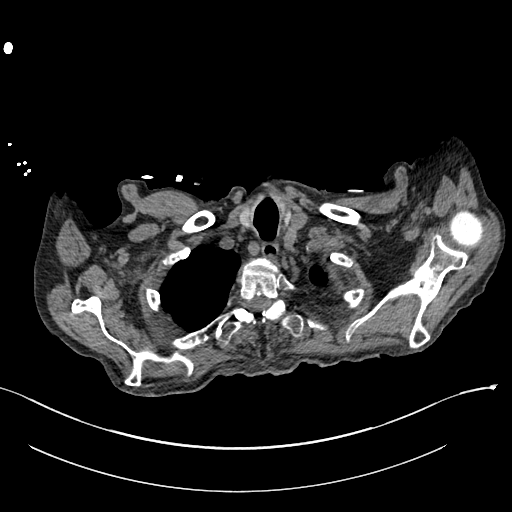
[im 317/339  bone]
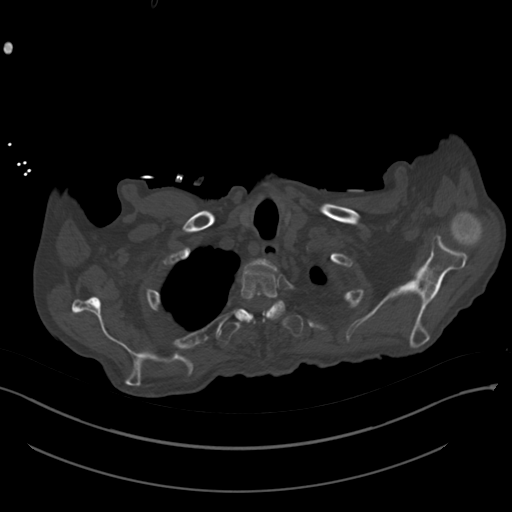

[12 of 46 positions shown; findings below may reference images not displayed]

FINDINGS: CT CHEST FINDINGS

Cardiovascular: Heart size is normal. There is no significant
pericardial fluid, thickening or pericardial calcification. There is
aortic atherosclerosis, as well as atherosclerosis of the great
vessels of the mediastinum and the coronary arteries, including
calcified atherosclerotic plaque in the left main, left anterior
descending, left circumflex and right coronary arteries.
Calcifications of the aortic valve and mitral annulus.

Mediastinum/Nodes: No pathologically enlarged mediastinal or hilar
lymph nodes. Esophagus is unremarkable in appearance. No axillary
lymphadenopathy.

Lungs/Pleura: There is near complete collapse/consolidation of the
left lower lobe, with extensive bronchiectasis within the left lower
lobe, much of which is opacified by retained mucus. Widespread
bronchial wall thickening, thickening of the peribronchovascular
interstitium with peribronchovascular micro and macronodularity
noted elsewhere in the lungs bilaterally. Background of mild to
moderate centrilobular and paraseptal emphysema. Thick-walled cavity
measuring approximately 2.3 x 5.7 x 4.0 cm (axial image 50 of series
5) in the posterior aspect of the left upper lobe has some dependent
fluid/ debris.

Musculoskeletal: Mild compression of superior endplate anteriorly at
T6 with loss in 10% loss of anterior vertebral body height. Multiple
old healed left-sided rib fractures. There are no aggressive
appearing lytic or blastic lesions noted in the visualized portions
of the skeleton.

CT ABDOMEN PELVIS FINDINGS

Hepatobiliary: No definite cystic or solid hepatic lesions are
identified on today's noncontrast CT examination. Tiny partially
calcified gallstones lie dependently in the gallbladder. Gallbladder
is moderately distended. No gallbladder wall thickening,
pericholecystic fluid or surrounding inflammatory changes are noted
to strongly suggest an acute cholecystitis at this time.

Pancreas: No definite pancreatic mass or peripancreatic inflammatory
changes are noted on today's noncontrast CT examination.

Spleen: Unremarkable.

Adrenals/Urinary Tract: Unenhanced appearance of the kidneys and
bilateral adrenal glands is normal. No hydroureteronephrosis.
Urinary bladder is partially obscured by beam hardening artifact
from the patient's left hip arthroplasty, but visualized portions of
the urinary bladder are unremarkable in appearance.

Stomach/Bowel: Unenhanced appearance of the stomach is normal. There
is no pathologic dilatation of small bowel or colon. Numerous
colonic diverticulae are noted, most evident in the sigmoid colon
and descending colon, without overt surrounding inflammatory changes
to strongly suggest an acute diverticulitis at this time. The
appendix is not confidently identified and may be surgically absent.
Regardless, there are no inflammatory changes noted adjacent to the
cecum to suggest the presence of an acute appendicitis at this time.

Vascular/Lymphatic: Aortic atherosclerosis, without definite
aneurysm in the abdominal or pelvic vasculature. No lymphadenopathy
noted in the abdomen or pelvis on today's noncontrast CT
examination.

Reproductive: Prostate gland and seminal vesicles are unremarkable
in appearance.

Other: No significant volume of ascites.  No pneumoperitoneum.

Musculoskeletal: Status post left hip arthroplasty. Old healed
fractures of the left superior and inferior pubic rami. There are no
aggressive appearing lytic or blastic lesions noted in the
visualized portions of the skeleton.
IMPRESSION: 1. No acute findings in the abdomen or pelvis to account for the
patient's symptoms. However, there is extensive colonic
diverticulosis, particularly of the descending colon and sigmoid
colon.
2. The appearance of the lungs suggests a chronic indolent atypical
infectious process such as ANUAR (mycobacterium avium intracellulare).
At this time, this is complicated by a near complete collapse and
consolidation of the left lower lobe, as well as the presence of a
thick-walled cavitary area in the posterior aspect of the left upper
lobe, as detailed above.
3. Mild to moderate centrilobular and paraseptal emphysema; imaging
findings suggestive of underlying COPD.
4. Aortic atherosclerosis, in addition to left main and 3 vessel
coronary artery disease.
5. There are calcifications of the aortic valve and mitral annulus.
Echocardiographic correlation for evaluation of potential valvular
dysfunction may be warranted if clinically indicated.

Aortic Atherosclerosis (QBATN-9SY.Y) and Emphysema (QBATN-R69.9).

## 2018-05-04 IMAGING — CR DG ABDOMEN 2V
2 series · 2 of 2 positions shown · non-contrast
Comparison: CT scan of April 12, 2017.

CLINICAL DATA: Abdominal pain.

EXAM:
ABDOMEN - 2 VIEW

[AP]
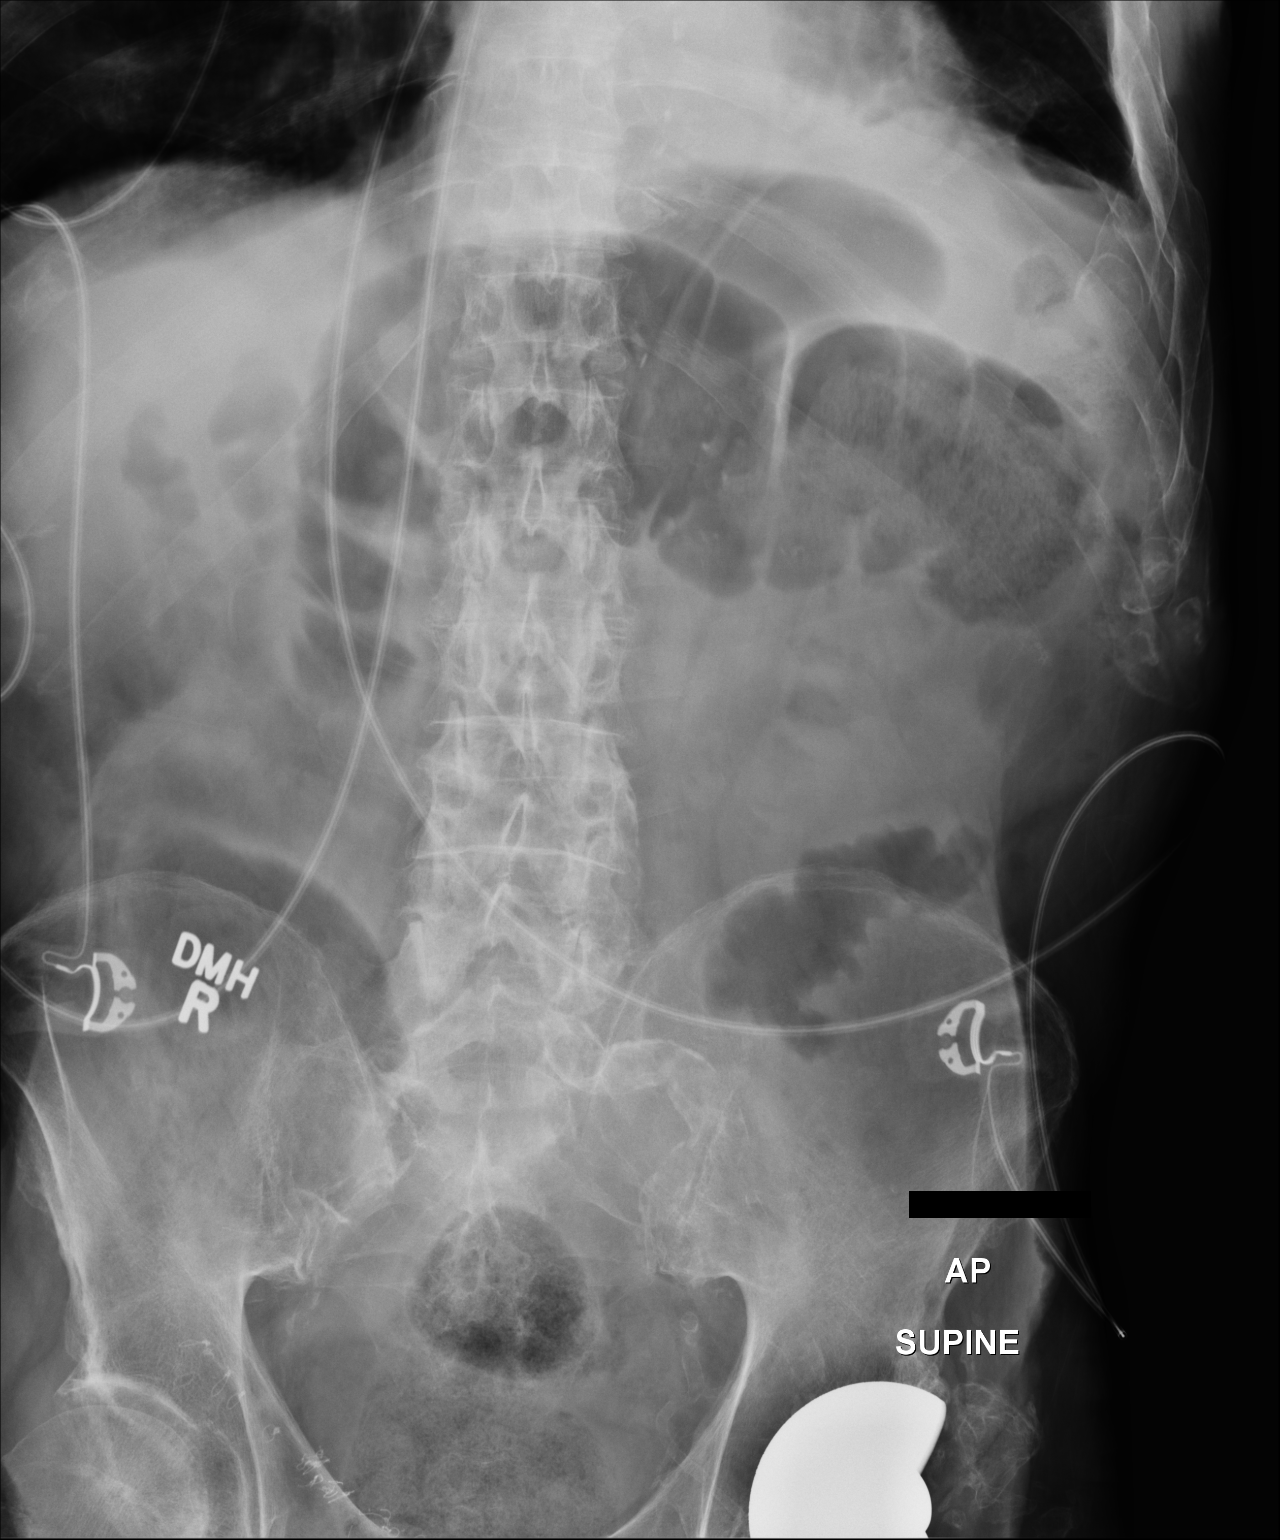

[pa lld]
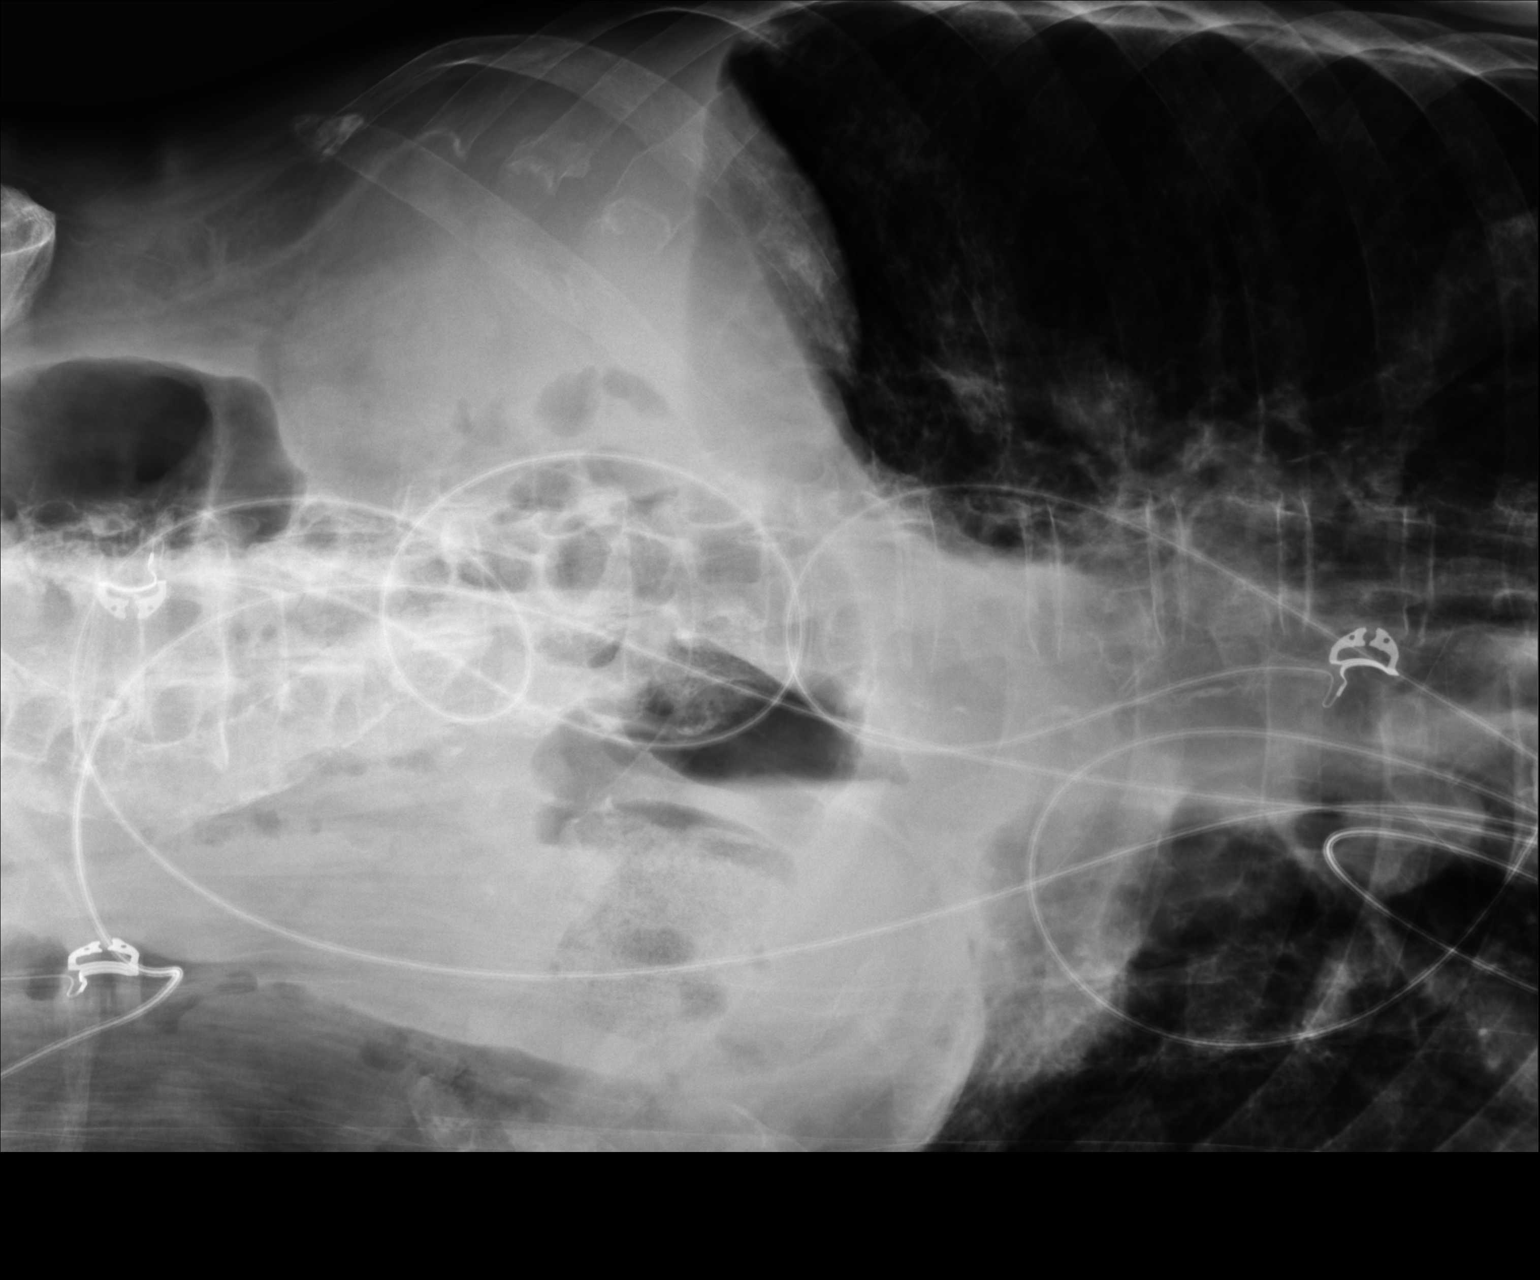

[2 of 2 positions shown; findings below may reference images not displayed]

FINDINGS: The bowel gas pattern is normal. Stool is noted in the transverse
colon and rectum. There is no evidence of free air. No radio-opaque
calculi or other significant radiographic abnormality is seen.
Atherosclerosis of abdominal aorta is noted.
IMPRESSION: Aortic atherosclerosis. No evidence of bowel obstruction or ileus.
No definite free air is noted.
